# Patient Record
Sex: Male | Born: 1940 | Race: Black or African American | Hispanic: No | Marital: Married | Smoking: Never smoker
Health system: Southern US, Community
[De-identification: ages and names within clinical notes are randomized; demographics above are authoritative.]

## PROBLEM LIST (undated history)

## (undated) DIAGNOSIS — I10 Essential (primary) hypertension: Secondary | ICD-10-CM

## (undated) DIAGNOSIS — I499 Cardiac arrhythmia, unspecified: Secondary | ICD-10-CM

## (undated) HISTORY — PX: CHOLECYSTECTOMY: SHX55

---

## 2006-07-08 ENCOUNTER — Encounter: Admission: RE | Admit: 2006-07-08 | Discharge: 2006-07-08 | Payer: Self-pay | Admitting: Internal Medicine

## 2009-04-16 ENCOUNTER — Ambulatory Visit: Payer: Self-pay | Admitting: Interventional Radiology

## 2009-04-16 ENCOUNTER — Emergency Department (HOSPITAL_BASED_OUTPATIENT_CLINIC_OR_DEPARTMENT_OTHER): Admission: EM | Admit: 2009-04-16 | Discharge: 2009-04-17 | Payer: Self-pay | Admitting: Emergency Medicine

## 2009-04-17 ENCOUNTER — Inpatient Hospital Stay (HOSPITAL_COMMUNITY): Admission: EM | Admit: 2009-04-17 | Discharge: 2009-04-21 | Payer: Self-pay | Admitting: Emergency Medicine

## 2009-04-18 ENCOUNTER — Encounter (INDEPENDENT_AMBULATORY_CARE_PROVIDER_SITE_OTHER): Payer: Self-pay

## 2010-01-06 ENCOUNTER — Emergency Department (HOSPITAL_BASED_OUTPATIENT_CLINIC_OR_DEPARTMENT_OTHER)
Admission: EM | Admit: 2010-01-06 | Discharge: 2010-01-07 | Payer: Self-pay | Source: Home / Self Care | Admitting: Emergency Medicine

## 2010-04-17 LAB — COMPREHENSIVE METABOLIC PANEL
ALT: 92 U/L — ABNORMAL HIGH (ref 0–53)
AST: 34 U/L (ref 0–37)
AST: 82 U/L — ABNORMAL HIGH (ref 0–37)
AST: 91 U/L — ABNORMAL HIGH (ref 0–37)
AST: 58 U/L — ABNORMAL HIGH (ref 0–37)
Albumin: 3.5 g/dL (ref 3.5–5.2)
Alkaline Phosphatase: 86 U/L (ref 39–117)
BUN: 17 mg/dL (ref 6–23)
CO2: 28 mEq/L (ref 19–32)
CO2: 29 mEq/L (ref 19–32)
CO2: 29 mEq/L (ref 19–32)
Calcium: 7.8 mg/dL — ABNORMAL LOW (ref 8.4–10.5)
Chloride: 97 mEq/L (ref 96–112)
Creatinine, Ser: 1.66 mg/dL — ABNORMAL HIGH (ref 0.4–1.5)
Creatinine, Ser: 2.16 mg/dL — ABNORMAL HIGH (ref 0.4–1.5)
Creatinine, Ser: 1.3 mg/dL (ref 0.4–1.5)
GFR calc Af Amer: 37 mL/min — ABNORMAL LOW (ref >60)
GFR calc Af Amer: 50 mL/min — ABNORMAL LOW (ref >60)
GFR calc Af Amer: 55 mL/min — ABNORMAL LOW (ref >60)
GFR calc non Af Amer: 31 mL/min — ABNORMAL LOW (ref >60)
GFR calc non Af Amer: 55 mL/min — ABNORMAL LOW (ref >60)
Glucose, Bld: 188 mg/dL — ABNORMAL HIGH (ref 70–99)
Glucose, Bld: 217 mg/dL — ABNORMAL HIGH (ref 70–99)
Potassium: 3.6 mEq/L (ref 3.5–5.1)
Sodium: 135 mEq/L (ref 135–145)
Total Bilirubin: 0.9 mg/dL (ref 0.3–1.2)
Total Bilirubin: 0.7 mg/dL (ref 0.3–1.2)

## 2010-04-17 LAB — URINALYSIS, ROUTINE W REFLEX MICROSCOPIC
Bilirubin Urine: NEGATIVE
Bilirubin Urine: NEGATIVE
Glucose, UA: 100 mg/dL — AB
Ketones, ur: NEGATIVE mg/dL
Leukocytes, UA: NEGATIVE
Leukocytes, UA: NEGATIVE
Nitrite: NEGATIVE
Protein, ur: 30 mg/dL — AB
Urobilinogen, UA: 0.2 mg/dL (ref 0.0–1.0)
pH: 5.5 (ref 5.0–8.0)

## 2010-04-17 LAB — BASIC METABOLIC PANEL
CO2: 26 mEq/L (ref 19–32)
Chloride: 98 mEq/L (ref 96–112)
Creatinine, Ser: 1.59 mg/dL — ABNORMAL HIGH (ref 0.4–1.5)
GFR calc Af Amer: 53 mL/min — ABNORMAL LOW (ref >60)
Sodium: 133 mEq/L — ABNORMAL LOW (ref 135–145)

## 2010-04-17 LAB — DIFFERENTIAL
Basophils Absolute: 0 10*3/uL (ref 0.0–0.1)
Basophils Absolute: 0.3 10*3/uL — ABNORMAL HIGH (ref 0.0–0.1)
Basophils Relative: 0 % (ref 0–1)
Eosinophils Absolute: 0 10*3/uL (ref 0.0–0.7)
Lymphocytes Relative: 6 % — ABNORMAL LOW (ref 12–46)
Lymphocytes Relative: 11 % — ABNORMAL LOW (ref 12–46)
Lymphs Abs: 1.1 10*3/uL (ref 0.7–4.0)
Monocytes Absolute: 1 10*3/uL (ref 0.1–1.0)
Monocytes Relative: 7 % (ref 3–12)
Neutro Abs: 13.3 10*3/uL — ABNORMAL HIGH (ref 1.7–7.7)
Neutrophils Relative %: 81 % — ABNORMAL HIGH (ref 43–77)

## 2010-04-17 LAB — GLUCOSE, CAPILLARY
Glucose-Capillary: 164 mg/dL — ABNORMAL HIGH (ref 70–99)
Glucose-Capillary: 184 mg/dL — ABNORMAL HIGH (ref 70–99)
Glucose-Capillary: 196 mg/dL — ABNORMAL HIGH (ref 70–99)
Glucose-Capillary: 206 mg/dL — ABNORMAL HIGH (ref 70–99)
Glucose-Capillary: 211 mg/dL — ABNORMAL HIGH (ref 70–99)
Glucose-Capillary: 215 mg/dL — ABNORMAL HIGH (ref 70–99)
Glucose-Capillary: 222 mg/dL — ABNORMAL HIGH (ref 70–99)
Glucose-Capillary: 223 mg/dL — ABNORMAL HIGH (ref 70–99)
Glucose-Capillary: 248 mg/dL — ABNORMAL HIGH (ref 70–99)
Glucose-Capillary: 249 mg/dL — ABNORMAL HIGH (ref 70–99)
Glucose-Capillary: 249 mg/dL — ABNORMAL HIGH (ref 70–99)

## 2010-04-17 LAB — HEMOGLOBIN A1C
Hgb A1c MFr Bld: 7.4 % — ABNORMAL HIGH (ref 4.6–6.1)
Mean Plasma Glucose: 166 mg/dL

## 2010-04-17 LAB — CBC
HCT: 31 % — ABNORMAL LOW (ref 39.0–52.0)
HCT: 38.5 % — ABNORMAL LOW (ref 39.0–52.0)
Hemoglobin: 11.1 g/dL — ABNORMAL LOW (ref 13.0–17.0)
Hemoglobin: 9.9 g/dL — ABNORMAL LOW (ref 13.0–17.0)
MCHC: 31.8 g/dL (ref 30.0–36.0)
MCHC: 32.1 g/dL (ref 30.0–36.0)
MCHC: 32.4 g/dL (ref 30.0–36.0)
MCV: 73.4 fL — ABNORMAL LOW (ref 78.0–100.0)
MCV: 73.9 fL — ABNORMAL LOW (ref 78.0–100.0)
MCV: 74.2 fL — ABNORMAL LOW (ref 78.0–100.0)
MCV: 74.6 fL — ABNORMAL LOW (ref 78.0–100.0)
Platelets: 151 10*3/uL (ref 150–400)
Platelets: 168 10*3/uL (ref 150–400)
Platelets: 196 10*3/uL (ref 150–400)
RBC: 4.22 MIL/uL (ref 4.22–5.81)
RBC: 4.43 MIL/uL (ref 4.22–5.81)
RBC: 5.15 MIL/uL (ref 4.22–5.81)
RDW: 15.5 % (ref 11.5–15.5)
WBC: 16.4 10*3/uL — ABNORMAL HIGH (ref 4.0–10.5)

## 2010-04-17 LAB — URINE MICROSCOPIC-ADD ON

## 2010-04-17 LAB — HEMOCCULT GUIAC POC 1CARD (OFFICE)

## 2011-03-11 ENCOUNTER — Encounter (HOSPITAL_BASED_OUTPATIENT_CLINIC_OR_DEPARTMENT_OTHER): Payer: Self-pay | Admitting: *Deleted

## 2011-03-11 ENCOUNTER — Emergency Department (HOSPITAL_BASED_OUTPATIENT_CLINIC_OR_DEPARTMENT_OTHER)
Admission: EM | Admit: 2011-03-11 | Discharge: 2011-03-11 | Discharge status: Against Medical Advice | Payer: Commercial Managed Care - PPO | Attending: Emergency Medicine | Admitting: Emergency Medicine

## 2011-03-11 DIAGNOSIS — M25579 Pain in unspecified ankle and joints of unspecified foot: Secondary | ICD-10-CM | POA: Insufficient documentation

## 2011-03-11 DIAGNOSIS — M25529 Pain in unspecified elbow: Secondary | ICD-10-CM | POA: Insufficient documentation

## 2011-03-11 HISTORY — DX: Essential (primary) hypertension: I10

## 2011-03-11 NOTE — ED Notes (Signed)
Per registration pt decided to leave- pt did not speak to RN before leaving department

## 2011-03-11 NOTE — ED Notes (Signed)
Hx of gout- pain started yesterday in left ankle and right elbow- cough x 1 week- requests chest xray

## 2012-02-05 ENCOUNTER — Ambulatory Visit: Payer: Commercial Managed Care - PPO | Admitting: *Deleted

## 2012-02-28 ENCOUNTER — Ambulatory Visit: Payer: Commercial Managed Care - PPO | Admitting: *Deleted

## 2012-04-07 ENCOUNTER — Ambulatory Visit: Payer: Commercial Managed Care - PPO | Admitting: *Deleted

## 2013-09-26 ENCOUNTER — Encounter: Payer: Self-pay | Admitting: *Deleted

## 2013-09-26 DIAGNOSIS — I10 Essential (primary) hypertension: Secondary | ICD-10-CM

## 2018-02-27 ENCOUNTER — Emergency Department (HOSPITAL_BASED_OUTPATIENT_CLINIC_OR_DEPARTMENT_OTHER)
Admission: EM | Admit: 2018-02-27 | Discharge: 2018-02-27 | Disposition: A | Discharge status: Home / Self Care | Payer: Medicare HMO | Attending: Emergency Medicine | Admitting: Emergency Medicine

## 2018-02-27 ENCOUNTER — Emergency Department (HOSPITAL_BASED_OUTPATIENT_CLINIC_OR_DEPARTMENT_OTHER): Payer: Medicare HMO

## 2018-02-27 ENCOUNTER — Encounter (HOSPITAL_BASED_OUTPATIENT_CLINIC_OR_DEPARTMENT_OTHER): Payer: Self-pay | Admitting: *Deleted

## 2018-02-27 ENCOUNTER — Other Ambulatory Visit: Payer: Self-pay

## 2018-02-27 DIAGNOSIS — E119 Type 2 diabetes mellitus without complications: Secondary | ICD-10-CM | POA: Insufficient documentation

## 2018-02-27 DIAGNOSIS — Z9049 Acquired absence of other specified parts of digestive tract: Secondary | ICD-10-CM | POA: Diagnosis not present

## 2018-02-27 DIAGNOSIS — Z79899 Other long term (current) drug therapy: Secondary | ICD-10-CM | POA: Insufficient documentation

## 2018-02-27 DIAGNOSIS — R0981 Nasal congestion: Secondary | ICD-10-CM | POA: Insufficient documentation

## 2018-02-27 DIAGNOSIS — M7918 Myalgia, other site: Secondary | ICD-10-CM | POA: Insufficient documentation

## 2018-02-27 DIAGNOSIS — Z7984 Long term (current) use of oral hypoglycemic drugs: Secondary | ICD-10-CM | POA: Insufficient documentation

## 2018-02-27 DIAGNOSIS — I10 Essential (primary) hypertension: Secondary | ICD-10-CM | POA: Diagnosis not present

## 2018-02-27 DIAGNOSIS — R05 Cough: Secondary | ICD-10-CM | POA: Diagnosis not present

## 2018-02-27 DIAGNOSIS — R6889 Other general symptoms and signs: Secondary | ICD-10-CM

## 2018-02-27 MED ORDER — OSELTAMIVIR PHOSPHATE 75 MG PO CAPS
75.0000 mg | ORAL_CAPSULE | Freq: Once | ORAL | Status: AC
  Administered 2018-02-27: 75 mg via ORAL
  Filled 2018-02-27: qty 1

## 2018-02-27 MED ORDER — DM-GUAIFENESIN ER 30-600 MG PO TB12: 1.0000 | ORAL_TABLET | Freq: Two times a day (BID) | ORAL | 0 refills | Status: DC

## 2018-02-27 MED ORDER — OSELTAMIVIR PHOSPHATE 75 MG PO CAPS: 75.0000 mg | ORAL_CAPSULE | Freq: Two times a day (BID) | ORAL | 0 refills | Status: DC

## 2018-02-27 NOTE — ED Triage Notes (Signed)
Cough with white sputum. Body aches, weakness. Hiccups.

## 2018-02-27 NOTE — Discharge Instructions (Addendum)
Symptoms seem to be flulike in nature.  Take the Tamiflu as directed.  Take the Mucinex DM as needed for the cough and congestion.  Return for any new or worse symptoms.  Work note provided.

## 2018-02-27 NOTE — ED Notes (Signed)
Patient is A&Ox4.  No signs of distress noted.  Please see providers complete history and physical exam.  

## 2018-02-27 NOTE — ED Provider Notes (Signed)
[facility] EMERGENCY DEPARTMENT Provider Note   CSN: [id number] Arrival date & time: 02/27/18  1901     History   Chief Complaint No chief complaint on file.   HPI Martin Richards is a 78 y.o. male.   Patient had the flu shot this year year.  Presenting with cough productive cough some body aches weakness had hiccups that have now resolved.  Symptoms started on Tuesday evening.  No fever no nausea vomiting no diarrhea.  Had some flu exposure.     Past Medical History:  Diagnosis Date  . Diabetes mellitus   . Gout   . Hypertension     Patient Active Problem List   Diagnosis Date Noted  . Hypertension     Past Surgical History:  Procedure Laterality Date  . CHOLECYSTECTOMY          Home Medications    Prior to Admission medications   Medication Sig Start Date End Date Taking? Authorizing Provider  allopurinol (ZYLOPRIM) 100 MG tablet Take 100 mg by mouth daily.   Yes [provider]  Multiple Vitamin (MULITIVITAMIN WITH MINERALS) TABS Take 1 tablet by mouth daily.   Yes [provider]  SitaGLIPtin-MetFORMIN HCl (JANUMET PO) Take 1 tablet by mouth daily.   Yes [provider]  Valsartan-Hydrochlorothiazide (DIOVAN HCT PO) Take 1 tablet by mouth daily.   Yes [provider]  dextromethorphan-guaiFENesin (MUCINEX DM) 30-600 MG 12hr tablet Take 1 tablet by mouth 2 (two) times daily. 02/27/18   Fredia Sorrow, MD  oseltamivir (TAMIFLU) 75 MG capsule Take 1 capsule (75 mg total) by mouth every 12 (twelve) hours. 02/27/18   Fredia Sorrow, MD  Phenylephrine-DM-GG-APAP ([place] FAST-MAX COLD FLU) 5-10-200-325 MG/10ML LIQD Take 20 mLs by mouth every 4 (four) hours as needed. For congestion    [provider]    Family History Family History  Problem Relation Age of Onset  . Hypertension Mother   . Hypertension Father   . Cancer Sister   . Diabetes Brother   . Hypertension Brother     Social History Social  History   Tobacco Use  . Smoking status: Never Smoker  . Smokeless tobacco: Never Used  Substance Use Topics  . Alcohol use: Yes    Comment: rare  . Drug use: No     Allergies   Patient has no known allergies.   Review of Systems Review of Systems  Constitutional: Negative for chills and fever.  HENT: Positive for congestion. Negative for rhinorrhea and sore throat.   Eyes: Negative for visual disturbance.  Respiratory: Positive for cough. Negative for shortness of breath.   Cardiovascular: Negative for chest pain and leg swelling.  Gastrointestinal: Negative for abdominal pain, diarrhea, nausea and vomiting.  Genitourinary: Negative for dysuria.  Musculoskeletal: Positive for myalgias. Negative for back pain and neck pain.  Skin: Negative for rash.  Neurological: Negative for dizziness, light-headedness and headaches.  Hematological: Does not bruise/bleed easily.  Psychiatric/Behavioral: Negative for confusion.     Physical Exam Updated Vital Signs BP 139/79 (BP Location: Right Arm)   Pulse 87   Temp 98.6 F (37 C) (Oral)   Resp 18   Ht 1.892 m (6' 2.5")   Wt 99.8 kg   SpO2 98%   BMI 27.87 kg/m   Physical Exam Vitals signs and nursing note reviewed.  Constitutional:      Appearance: He is well-developed.  HENT:     Head: Normocephalic and atraumatic.     Mouth/Throat:  Mouth: Mucous membranes are moist.     Pharynx: Oropharynx is clear.  Eyes:     Extraocular Movements: Extraocular movements intact.     Conjunctiva/sclera: Conjunctivae normal.     Pupils: Pupils are equal, round, and reactive to light.  Neck:     Musculoskeletal: Neck supple.  Cardiovascular:     Rate and Rhythm: Normal rate and regular rhythm.     Heart sounds: No murmur.  Pulmonary:     Effort: Pulmonary effort is normal. No respiratory distress.     Breath sounds: Normal breath sounds. No wheezing or rales.  Chest:     Chest wall: No tenderness.  Abdominal:     Palpations:  Abdomen is soft.     Tenderness: There is no abdominal tenderness.  Musculoskeletal: Normal range of motion.  Skin:    General: Skin is warm and dry.     Capillary Refill: Capillary refill takes less than 2 seconds.  Neurological:     General: No focal deficit present.     Mental Status: He is alert and oriented to person, place, and time.      ED Treatments / Results  Labs (all labs ordered are listed, but only abnormal results are displayed) Labs Reviewed - No data to display  EKG None  Radiology Dg Chest 2 View  Result Date: 02/27/2018 CLINICAL DATA:  Cough productive of sputum. EXAM: CHEST - 2 VIEW COMPARISON:  04/17/2017 FINDINGS: The heart size and mediastinal contours are within normal limits. Both lungs are clear. The visualized skeletal structures are unremarkable. IMPRESSION: No active cardiopulmonary disease. Electronically Signed   By: Kerby Moors M.D.   On: 02/27/2018 20:16    Procedures Procedures (including critical care time)  Medications Ordered in ED Medications  oseltamivir (TAMIFLU) capsule 75 mg (75 mg Oral Given 02/27/18 2248)     Initial Impression / Assessment and Plan / ED Course  I have reviewed the triage vital signs and the nursing notes.  Pertinent labs & imaging results that were available during my care of the patient were reviewed by me and considered in my medical decision making (see chart for details).     Patient symptoms flulike in nature.  Onset was Tuesday evening so he still in the 48 window.  Will give first dose of Tamiflu here and continue Tamiflu at home and Mucinex DM for the cough and congestion.  Patient did have the flu shot which is probably helping to minimize the severity of the symptoms.  Also could be just upper respiratory infection.  Chest x-ray negative for pneumonia.  Final Clinical Impressions(s) / ED Diagnoses   Final diagnoses:  Flu-like symptoms    ED Discharge Orders         Ordered     dextromethorphan-guaiFENesin [facility] DM) 30-600 MG 12hr tablet  2 times daily     02/27/18 2256    oseltamivir (TAMIFLU) 75 MG capsule  Every 12 hours     02/27/18 2256           Fredia Sorrow, MD 02/28/18 [phone]

## 2019-03-03 ENCOUNTER — Other Ambulatory Visit: Payer: Self-pay

## 2019-03-03 ENCOUNTER — Emergency Department (HOSPITAL_COMMUNITY): Payer: Medicare HMO

## 2019-03-03 ENCOUNTER — Inpatient Hospital Stay (HOSPITAL_COMMUNITY)
Admission: EM | Admit: 2019-03-03 | Discharge: 2019-03-08 | DRG: 683 | Disposition: A | Discharge status: Home / Self Care | Payer: Medicare HMO | Source: Ambulatory Visit | Attending: Internal Medicine | Admitting: Internal Medicine

## 2019-03-03 ENCOUNTER — Other Ambulatory Visit: Payer: Self-pay | Admitting: Physician Assistant

## 2019-03-03 ENCOUNTER — Encounter (HOSPITAL_COMMUNITY): Payer: Self-pay | Admitting: Emergency Medicine

## 2019-03-03 DIAGNOSIS — E785 Hyperlipidemia, unspecified: Secondary | ICD-10-CM | POA: Diagnosis present

## 2019-03-03 DIAGNOSIS — D351 Benign neoplasm of parathyroid gland: Secondary | ICD-10-CM | POA: Diagnosis present

## 2019-03-03 DIAGNOSIS — N189 Chronic kidney disease, unspecified: Secondary | ICD-10-CM | POA: Diagnosis present

## 2019-03-03 DIAGNOSIS — D509 Iron deficiency anemia, unspecified: Secondary | ICD-10-CM | POA: Diagnosis present

## 2019-03-03 DIAGNOSIS — I10 Essential (primary) hypertension: Secondary | ICD-10-CM | POA: Diagnosis not present

## 2019-03-03 DIAGNOSIS — N183 Chronic kidney disease, stage 3 unspecified: Secondary | ICD-10-CM | POA: Diagnosis not present

## 2019-03-03 DIAGNOSIS — Z20822 Contact with and (suspected) exposure to covid-19: Secondary | ICD-10-CM | POA: Diagnosis present

## 2019-03-03 DIAGNOSIS — E1122 Type 2 diabetes mellitus with diabetic chronic kidney disease: Secondary | ICD-10-CM | POA: Diagnosis present

## 2019-03-03 DIAGNOSIS — N400 Enlarged prostate without lower urinary tract symptoms: Secondary | ICD-10-CM | POA: Diagnosis present

## 2019-03-03 DIAGNOSIS — E860 Dehydration: Secondary | ICD-10-CM | POA: Diagnosis present

## 2019-03-03 DIAGNOSIS — M109 Gout, unspecified: Secondary | ICD-10-CM | POA: Diagnosis present

## 2019-03-03 DIAGNOSIS — I4891 Unspecified atrial fibrillation: Secondary | ICD-10-CM | POA: Diagnosis not present

## 2019-03-03 DIAGNOSIS — R0600 Dyspnea, unspecified: Secondary | ICD-10-CM | POA: Diagnosis not present

## 2019-03-03 DIAGNOSIS — N1832 Chronic kidney disease, stage 3b: Secondary | ICD-10-CM | POA: Diagnosis present

## 2019-03-03 DIAGNOSIS — Z9119 Patient's noncompliance with other medical treatment and regimen: Secondary | ICD-10-CM

## 2019-03-03 DIAGNOSIS — I480 Paroxysmal atrial fibrillation: Secondary | ICD-10-CM | POA: Diagnosis present

## 2019-03-03 DIAGNOSIS — Z9181 History of falling: Secondary | ICD-10-CM

## 2019-03-03 DIAGNOSIS — E118 Type 2 diabetes mellitus with unspecified complications: Secondary | ICD-10-CM | POA: Diagnosis not present

## 2019-03-03 DIAGNOSIS — Z79899 Other long term (current) drug therapy: Secondary | ICD-10-CM

## 2019-03-03 DIAGNOSIS — M6282 Rhabdomyolysis: Secondary | ICD-10-CM | POA: Diagnosis present

## 2019-03-03 DIAGNOSIS — Z833 Family history of diabetes mellitus: Secondary | ICD-10-CM

## 2019-03-03 DIAGNOSIS — N179 Acute kidney failure, unspecified: Principal | ICD-10-CM | POA: Diagnosis present

## 2019-03-03 DIAGNOSIS — R7989 Other specified abnormal findings of blood chemistry: Secondary | ICD-10-CM

## 2019-03-03 DIAGNOSIS — I129 Hypertensive chronic kidney disease with stage 1 through stage 4 chronic kidney disease, or unspecified chronic kidney disease: Secondary | ICD-10-CM | POA: Diagnosis present

## 2019-03-03 DIAGNOSIS — K635 Polyp of colon: Secondary | ICD-10-CM | POA: Diagnosis present

## 2019-03-03 DIAGNOSIS — Z7984 Long term (current) use of oral hypoglycemic drugs: Secondary | ICD-10-CM | POA: Diagnosis not present

## 2019-03-03 DIAGNOSIS — Z8249 Family history of ischemic heart disease and other diseases of the circulatory system: Secondary | ICD-10-CM

## 2019-03-03 DIAGNOSIS — K5730 Diverticulosis of large intestine without perforation or abscess without bleeding: Secondary | ICD-10-CM | POA: Diagnosis present

## 2019-03-03 DIAGNOSIS — K5900 Constipation, unspecified: Secondary | ICD-10-CM | POA: Diagnosis present

## 2019-03-03 DIAGNOSIS — I471 Supraventricular tachycardia: Secondary | ICD-10-CM | POA: Diagnosis not present

## 2019-03-03 DIAGNOSIS — K2970 Gastritis, unspecified, without bleeding: Secondary | ICD-10-CM | POA: Diagnosis present

## 2019-03-03 LAB — PHOSPHORUS: Phosphorus: 2 mg/dL — ABNORMAL LOW (ref 2.5–4.6)

## 2019-03-03 LAB — URINALYSIS, ROUTINE W REFLEX MICROSCOPIC
Bilirubin Urine: NEGATIVE
Glucose, UA: NEGATIVE mg/dL
Hgb urine dipstick: NEGATIVE
Ketones, ur: NEGATIVE mg/dL
Leukocytes,Ua: NEGATIVE
Nitrite: NEGATIVE
Protein, ur: NEGATIVE mg/dL
Specific Gravity, Urine: 1.013 (ref 1.005–1.030)
pH: 7 (ref 5.0–8.0)

## 2019-03-03 LAB — SARS CORONAVIRUS 2 (TAT 6-24 HRS): SARS Coronavirus 2: NEGATIVE

## 2019-03-03 LAB — COMPREHENSIVE METABOLIC PANEL
ALT: 55 U/L — ABNORMAL HIGH (ref 0–44)
AST: 58 U/L — ABNORMAL HIGH (ref 15–41)
Albumin: 4.1 g/dL (ref 3.5–5.0)
Alkaline Phosphatase: 77 U/L (ref 38–126)
Anion gap: 8 (ref 5–15)
BUN: 22 mg/dL (ref 8–23)
CO2: 26 mmol/L (ref 22–32)
Calcium: 13.6 mg/dL (ref 8.9–10.3)
Chloride: 101 mmol/L (ref 98–111)
Creatinine, Ser: 2.23 mg/dL — ABNORMAL HIGH (ref 0.61–1.24)
GFR calc Af Amer: 32 mL/min — ABNORMAL LOW (ref >60)
GFR calc non Af Amer: 28 mL/min — ABNORMAL LOW (ref >60)
Glucose, Bld: 127 mg/dL — ABNORMAL HIGH (ref 70–99)
Potassium: 4.1 mmol/L (ref 3.5–5.1)
Sodium: 135 mmol/L (ref 135–145)
Total Bilirubin: 0.4 mg/dL (ref 0.3–1.2)
Total Protein: 7.2 g/dL (ref 6.5–8.1)

## 2019-03-03 LAB — CK: Total CK: 719 U/L — ABNORMAL HIGH (ref 49–397)

## 2019-03-03 LAB — RETICULOCYTES
Immature Retic Fract: 15 % (ref 2.3–15.9)
RBC.: 4.51 MIL/uL (ref 4.22–5.81)
Retic Count, Absolute: 62.7 10*3/uL (ref 19.0–186.0)
Retic Ct Pct: 1.4 % (ref 0.4–3.1)

## 2019-03-03 LAB — CBC
HCT: 34.4 % — ABNORMAL LOW (ref 39.0–52.0)
Hemoglobin: 10.4 g/dL — ABNORMAL LOW (ref 13.0–17.0)
MCH: 22.8 pg — ABNORMAL LOW (ref 26.0–34.0)
MCHC: 30.2 g/dL (ref 30.0–36.0)
MCV: 75.4 fL — ABNORMAL LOW (ref 80.0–100.0)
Platelets: 225 10*3/uL (ref 150–400)
RBC: 4.56 MIL/uL (ref 4.22–5.81)
RDW: 16.9 % — ABNORMAL HIGH (ref 11.5–15.5)
WBC: 8.1 10*3/uL (ref 4.0–10.5)
nRBC: 0 % (ref 0.0–0.2)

## 2019-03-03 LAB — LIPASE, BLOOD: Lipase: 24 U/L (ref 11–51)

## 2019-03-03 LAB — MAGNESIUM: Magnesium: 1.2 mg/dL — ABNORMAL LOW (ref 1.7–2.4)

## 2019-03-03 LAB — GLUCOSE, CAPILLARY: Glucose-Capillary: 96 mg/dL (ref 70–99)

## 2019-03-03 MED ORDER — ENOXAPARIN SODIUM 30 MG/0.3ML ~~LOC~~ SOLN
30.0000 mg | SUBCUTANEOUS | Status: DC
  Administered 2019-03-04: 30 mg via SUBCUTANEOUS
  Filled 2019-03-03: qty 0.3

## 2019-03-03 MED ORDER — ONDANSETRON HCL 4 MG PO TABS: 4.0000 mg | ORAL_TABLET | Freq: Four times a day (QID) | ORAL | Status: DC | PRN

## 2019-03-03 MED ORDER — ONDANSETRON HCL 4 MG/2ML IJ SOLN
4.0000 mg | Freq: Four times a day (QID) | INTRAMUSCULAR | Status: DC | PRN
  Administered 2019-03-05: 4 mg via INTRAVENOUS
  Filled 2019-03-03: qty 2

## 2019-03-03 MED ORDER — SODIUM CHLORIDE 0.9 % IV BOLUS (SEPSIS)
1000.0000 mL | Freq: Once | INTRAVENOUS | Status: AC
  Administered 2019-03-03: 1000 mL via INTRAVENOUS

## 2019-03-03 MED ORDER — MAGNESIUM SULFATE 2 GM/50ML IV SOLN
2.0000 g | Freq: Once | INTRAVENOUS | Status: AC
  Administered 2019-03-03: 2 g via INTRAVENOUS
  Filled 2019-03-03: qty 50

## 2019-03-03 MED ORDER — SODIUM CHLORIDE 0.9 % IV SOLN: INTRAVENOUS | Status: AC

## 2019-03-03 MED ORDER — SODIUM PHOSPHATES 45 MMOLE/15ML IV SOLN
10.0000 mmol | Freq: Once | INTRAVENOUS | Status: AC
  Administered 2019-03-03: 10 mmol via INTRAVENOUS
  Filled 2019-03-03 (×2): qty 3.33

## 2019-03-03 MED ORDER — HYDROCODONE-ACETAMINOPHEN 5-325 MG PO TABS: 1.0000 | ORAL_TABLET | ORAL | Status: DC | PRN

## 2019-03-03 MED ORDER — INSULIN ASPART 100 UNIT/ML ~~LOC~~ SOLN
0.0000 [IU] | SUBCUTANEOUS | Status: DC
  Administered 2019-03-04: 2 [IU] via SUBCUTANEOUS
  Administered 2019-03-04: 1 [IU] via SUBCUTANEOUS
  Administered 2019-03-04: 2 [IU] via SUBCUTANEOUS
  Administered 2019-03-04: 3 [IU] via SUBCUTANEOUS
  Administered 2019-03-05: 1 [IU] via SUBCUTANEOUS
  Administered 2019-03-05 (×3): 2 [IU] via SUBCUTANEOUS
  Administered 2019-03-05: 3 [IU] via SUBCUTANEOUS
  Administered 2019-03-05: 2 [IU] via SUBCUTANEOUS
  Administered 2019-03-06 (×3): 1 [IU] via SUBCUTANEOUS
  Administered 2019-03-07 (×2): 2 [IU] via SUBCUTANEOUS
  Administered 2019-03-07: 1 [IU] via SUBCUTANEOUS
  Administered 2019-03-08 (×2): 2 [IU] via SUBCUTANEOUS

## 2019-03-03 MED ORDER — ACETAMINOPHEN 650 MG RE SUPP: 650.0000 mg | Freq: Four times a day (QID) | RECTAL | Status: DC | PRN

## 2019-03-03 MED ORDER — SODIUM CHLORIDE 0.9% FLUSH: 3.0000 mL | Freq: Once | INTRAVENOUS | Status: DC

## 2019-03-03 MED ORDER — ACETAMINOPHEN 325 MG PO TABS: 650.0000 mg | ORAL_TABLET | Freq: Four times a day (QID) | ORAL | Status: DC | PRN

## 2019-03-03 NOTE — H&P (Signed)
Martin Richards [id number] DOB: 1940/03/19 DOA: 03/03/2019     PCP: Patient, No Pcp Per   Outpatient Specialists:  NONE    Patient arrived to ER on 03/03/19 at 1614  Patient coming from: home Lives With family    Chief Complaint:   Chief Complaint  Patient presents with  . Dehydration  . Abdominal Pain    HPI: Martin Richards is a 79 y.o. male with medical history significant of HLD, CKD, HTN, anemia    Presented with PCP office she was told that he is dehydrated and orthostatic and was instructed to come being evaluated emergency department. Patient states that he has not been eating and drinking very well lately has been drinking a lot of sodas and very little water he reports he lost 8 pounds over the past week thinking that is because he has not been drinking water. He had a brief episode of abdominal pain today but otherwise no other complaints At he was seen by his primary care provider and his diabetes medications was changed because he was told that he has too much calcium in it. He denies taking Tums or any other supplements over-the-counter He does occasionally take Advil PM and Alka-Seltzer. Patient states he usually does not drink but have had some alcohol lately to control his pain in the abdomen although was very mild reports he did not change his pain Infectious risk factors:  Reports fatigue    In  ER  cOVID TEST   PCR testing  Pending  No results found for: SARSCOV2NAA   Regarding pertinent Chronic problems:      HTN on Diovan  Gout on allopurinol       DM 2 -  Lab Results  Component Value Date   HGBA1C (H) 04/17/2009    7.4 (NOTE) The ADA recommends the following therapeutic goal for glycemic control related to Hgb A1c measurement: Goal of therapy: <6.5 Hgb A1c  Reference: American Diabetes Association: Clinical Practice Recommendations 2010, Diabetes Care, 2010, 33: (Suppl  1).    on PO meds only,          CKD stage III - baseline  Cr 1.6 Lab Results  Component Value Date   CREATININE 2.23 (H) 03/03/2019   CREATININE 1.59 (H) 04/21/2009   CREATININE 2.16 (H) 04/20/2009      While in ER: Found to have calcium of 13.6 and creatinine elevated to 2.5 from baseline 1.6  mg at 1.2 Phosphorus 2.0  The following Work up has been ordered so far:  Orders Placed This Encounter  Procedures  . SARS CORONAVIRUS 2 (TAT 6-24 HRS) Nasopharyngeal Nasopharyngeal Swab  . Lipase, blood  . Comprehensive metabolic panel  . CBC  . Urinalysis, Routine w reflex microscopic  . Diet NPO time specified  . Saline Lock IV, Maintain IV access  . Consult to hospitalist  ALL PATIENTS BEING ADMITTED/HAVING PROCEDURES NEED COVID-19 SCREENING     Following Medications were ordered in ER: Medications  sodium chloride flush (NS) 0.9 % injection 3 mL (has no administration in time range)  sodium chloride 0.9 % bolus 1,000 mL (1,000 mLs Intravenous New Bag/Given 03/03/19 1938)        Consult Orders  (From admission, onward)         Start     Ordered   03/03/19 1929  Consult to hospitalist  ALL PATIENTS BEING ADMITTED/HAVING PROCEDURES NEED COVID-19 SCREENING  Once    Comments: ALL PATIENTS BEING ADMITTED/HAVING PROCEDURES NEED  COVID-19 SCREENING  Provider:  (Not yet assigned)  Question Answer Comment  Place call to: [facility],  call [phone]   Reason for Consult Admit      03/03/19 1929           Significant initial  Findings: Abnormal Labs Reviewed  COMPREHENSIVE METABOLIC PANEL - Abnormal; Notable for the following components:      Result Value   Glucose, Bld 127 (*)    Creatinine, Ser 2.23 (*)    Calcium 13.6 (*)    AST 58 (*)    ALT 55 (*)    GFR calc non Af Amer 28 (*)    GFR calc Af Amer 32 (*)    All other components within normal limits  CBC - Abnormal; Notable for the following components:   Hemoglobin 10.4 (*)    HCT 34.4 (*)    MCV 75.4 (*)    MCH 22.8 (*)    RDW 16.9 (*)    All other components  within normal limits     Otherwise labs showing:    Recent Labs  Lab 03/03/19 1644  NA 135  K 4.1  CO2 26  GLUCOSE 127*  BUN 22  CREATININE 2.23*  CALCIUM 13.6*    Cr    Up from baseline see below Lab Results  Component Value Date   CREATININE 2.23 (H) 03/03/2019   CREATININE 1.59 (H) 04/21/2009   CREATININE 2.16 (H) 04/20/2009    Recent Labs  Lab 03/03/19 1644  AST 58*  ALT 55*  ALKPHOS 77  BILITOT 0.4  PROT 7.2  ALBUMIN 4.1   Lab Results  Component Value Date   CALCIUM 13.6 (HH) 03/03/2019     WBC      Component Value Date/Time   WBC 8.1 03/03/2019 1644   ANC    Component Value Date/Time   NEUTROABS 16.5 (H) 04/17/2009 1806   ALC No components found for: LYMPHAB    Plt: Lab Results  Component Value Date   PLT 225 03/03/2019       COVID-19 Labs  No results for input(s): DDIMER, FERRITIN, LDH, CRP in the last 72 hours.  No results found for: SARSCOV2NAA    HG/HCT   stable,      Component Value Date/Time   HGB 10.4 (L) 03/03/2019 1644   HCT 34.4 (L) 03/03/2019 1644    Recent Labs  Lab 03/03/19 1644  LIPASE 24   No results for input(s): AMMONIA in the last 168 hours.  No components found for: LABALBU    Cardiac Panel (last 3 results) Recent Labs    03/03/19 2025  CKTOTAL 719*       ECG: Ordered     UA  no evidence of UTI    Urine analysis:    Component Value Date/Time   COLORURINE YELLOW 03/03/2019 2152   APPEARANCEUR CLEAR 03/03/2019 2152   LABSPEC 1.013 03/03/2019 2152   PHURINE 7.0 03/03/2019 2152   GLUCOSEU NEGATIVE 03/03/2019 2152   HGBUR NEGATIVE 03/03/2019 2152   BILIRUBINUR NEGATIVE 03/03/2019 2152   KETONESUR NEGATIVE 03/03/2019 2152   PROTEINUR NEGATIVE 03/03/2019 2152   UROBILINOGEN 0.2 04/17/2009 1913   NITRITE NEGATIVE 03/03/2019 2152   LEUKOCYTESUR NEGATIVE 03/03/2019 2152       Ordered    CXR -  NON acute      ED Triage Vitals  Enc Vitals Group     BP 03/03/19 1619 (!) 148/78     Pulse  Rate 03/03/19 1619 72  Resp 03/03/19 1619 16     Temp 03/03/19 1619 98.4 F (36.9 C)     Temp Source 03/03/19 1619 Oral     SpO2 03/03/19 1619 100 %     Weight --      Height --      Head Circumference --      Peak Flow --      Pain Score 03/03/19 1637 1     Pain Loc --      Pain Edu? --      Excl. in [place]? --   TMAX(24)@       Latest  Blood pressure (!) 148/78, pulse 72, temperature 98.4 F (36.9 C), temperature source Oral, resp. rate 16, SpO2 100 %.     Hospitalist was called for admission for hypercalcemia, dehydration and rhabdo   Review of Systems:    Pertinent positives include: fatigue, weight loss  abdominal pain,  Constitutional:  No weight loss, night sweats, Fevers, chills,  HEENT:  No headaches, Difficulty swallowing,Tooth/dental problems,Sore throat,  No sneezing, itching, ear ache, nasal congestion, post nasal drip,  Cardio-vascular:  No chest pain, Orthopnea, PND, anasarca, dizziness, palpitations.no Bilateral lower extremity swelling  GI:  No heartburn, indigestion, nausea, vomiting, diarrhea, change in bowel habits, loss of appetite, melena, blood in stool, hematemesis Resp:  no shortness of breath at rest. No dyspnea on exertion, No excess mucus, no productive cough, No non-productive cough, No coughing up of blood.No change in color of mucus.No wheezing. Skin:  no rash or lesions. No jaundice GU:  no dysuria, change in color of urine, no urgency or frequency. No straining to urinate.  No flank pain.  Musculoskeletal:  No joint pain or no joint swelling. No decreased range of motion. No back pain.  Psych:  No change in mood or affect. No depression or anxiety. No memory loss.  Neuro: no localizing neurological complaints, no tingling, no weakness, no double vision, no gait abnormality, no slurred speech, no confusion  All systems reviewed and apart from [place] all are negative  Past Medical History:   Past Medical History:  Diagnosis Date  .  Diabetes mellitus   . Gout   . Hypertension       Past Surgical History:  Procedure Laterality Date  . CHOLECYSTECTOMY      Social History:  Ambulatory  independently      reports that he has never smoked. He has never used smokeless tobacco. He reports current alcohol use. He reports that he does not use drugs.     Family History:   Family History  Problem Relation Age of Onset  . Hypertension Mother   . Hypertension Father   . Cancer Sister   . Diabetes Brother   . Hypertension Brother     Allergies: No Known Allergies   Prior to Admission medications   Medication Sig Start Date End Date Taking? Authorizing Provider  allopurinol (ZYLOPRIM) 100 MG tablet Take 100 mg by mouth daily.    [provider]  dextromethorphan-guaiFENesin (MUCINEX DM) 30-600 MG 12hr tablet Take 1 tablet by mouth 2 (two) times daily. 02/27/18   Fredia Sorrow, MD  Multiple Vitamin (MULITIVITAMIN WITH MINERALS) TABS Take 1 tablet by mouth daily.    [provider]  oseltamivir (TAMIFLU) 75 MG capsule Take 1 capsule (75 mg total) by mouth every 12 (twelve) hours. 02/27/18   Fredia Sorrow, MD  Phenylephrine-DM-GG-APAP ([place] FAST-MAX COLD FLU) 5-10-200-325 MG/10ML LIQD Take 20 mLs by mouth every 4 (four) hours  as needed. For congestion    [provider]  SitaGLIPtin-MetFORMIN HCl (JANUMET PO) Take 1 tablet by mouth daily.    [provider]  Valsartan-Hydrochlorothiazide (DIOVAN HCT PO) Take 1 tablet by mouth daily.    [provider]   Physical Exam: Blood pressure (!) 148/78, pulse 72, temperature 98.4 F (36.9 C), temperature source Oral, resp. rate 16, SpO2 100 %. 1. General:  in No  Acute distress    Chronically ill  -appearing 2. Psychological: Alert and Oriented 3. Head/ENT:    Dry Mucous Membranes                          Head Non traumatic, neck supple                          Poor Dentition 4. SKIN:   decreased Skin turgor,  Skin clean  Dry and intact no rash 5. Heart: Regular rate and rhythm no  Murmur, no Rub or gallop 6. Lungs: no wheezes or crackles   7. Abdomen: Soft,  non-tender, Non distended   obese  bowel sounds present 8. Lower extremities: no clubbing, cyanosis, no edema 9. Neurologically Grossly intact, moving all 4 extremities equally  10. MSK: Normal range of motion   All other LABS:     Recent Labs  Lab 03/03/19 1644  WBC 8.1  HGB 10.4*  HCT 34.4*  MCV 75.4*  PLT 225     Recent Labs  Lab 03/03/19 1644  NA 135  K 4.1  CL 101  CO2 26  GLUCOSE 127*  BUN 22  CREATININE 2.23*  CALCIUM 13.6*     Recent Labs  Lab 03/03/19 1644  AST 58*  ALT 55*  ALKPHOS 77  BILITOT 0.4  PROT 7.2  ALBUMIN 4.1       Cultures: No results found for: SDES, SPECREQUEST, CULT, REPTSTATUS   Radiological Exams on Admission: DG Chest 2 View  Result Date: 03/03/2019 CLINICAL DATA:  Weakness. Dehydration. EXAM: CHEST - 2 VIEW COMPARISON:  Chest radiograph 02/27/2018 FINDINGS: The cardiomediastinal contours are normal. The lungs are clear. Pulmonary vasculature is normal. No consolidation, pleural effusion, or pneumothorax. No acute osseous abnormalities are seen. Degenerative change in the spine. IMPRESSION: No acute chest findings. Electronically Signed   By: Keith Rake M.D.   On: 03/03/2019 20:36    Chart has been reviewed   Assessment/Plan  79 y.o. male with medical history significant of HLD, CKD, HTN, anemia  Admitted for patient hypercalcemia rhabdomyolysis elevated LFT's, hypomagnesemia, hypophosphatemia  Present on Admission:  . Hypercalcemia -will rehydrate and repeat still elevated at 13 will  treat with calcitonin  No known underlying malignancy in no changes in mental status.  Obtain EKG. Check TSH, PTH, PTH related peptide Would need work up for malignancy  Hypomagnesemia - will replace  Hypophosphatemia - will replace    History of essential hypertension.  Hold home medications  restart when able may need different medications at the time of discharge such as beta-blockers given electrolytes abnormalities  . Acute on chronic renal failure (HCC) - hold Diovan for tonight avoid nephrotoxic medications obtain urine electrolytes    Dm2 -  Order Sensitive SSI   -  check TSH and HgA1C  - Hold by mouth medications    . Dehydration rehydrate and follow Recheck orthostatics   . Elevated LFTs most likely secondary to dehydration we will continue to follow  .  Rhabdomyolysis in the setting of decreased p.o. intake will rehydrate and follow   Other plan as per orders.  DVT prophylaxis: Lovenox     Code Status:  FULL CODE as per patient   I had personally discussed CODE STATUS with patient    Family Communication:   Family not at  Bedside    Disposition Plan:      To home once workup is complete and patient is stable                     Consults called: none  Admission status:  ED Disposition    ED Disposition Condition [place]: [place] [[id number]]  Level of Care: Telemetry Medical [[id number]]  May admit patient to [facility] or [facility] if equivalent level of care is available:: Yes  Covid Evaluation: Asymptomatic Screening Protocol (No Symptoms)  Diagnosis: Hypercalcemia [275.42.ICD-9-CM]  Admitting Physician: Toy Baker [[id number]]  Attending Physician: Toy Baker [[id number]]  Estimated length of stay: past midnight tomorrow  Certification:: I certify this patient will need inpatient services for at least 2 midnights         inpatient     Expect 2 midnight stay secondary to severity of patient's current illness including     Severe lab/radiological/exam abnormalities including:    hypercalcemia. rhabdo and extensive comorbidities including:    DM2  . CKD   That are currently affecting medical management.   I expect  patient to be hospitalized for 2 midnights requiring inpatient medical  care.  Patient is at high risk for adverse outcome (such as loss of life or disability) if not treated.  Indication for inpatient stay as follows:  Severe changes in electrolytes needing aggressive VI fluid ressusitation  Need for IV fluids,     Level of care    tele  For   24H    Precautions: admitted as   asymptomatic screening protocol  No active isolations   PPE: Used by the provider:   P100  eye Goggles,  Gloves    Vidhi Delellis 03/04/2019, 12:58 AM    [facility]     after 2 AM please page floor coverage PA If 7AM-7PM, please contact the day team taking care of the patient using [email or web address]   Patient was evaluated in the context of the global COVID-19 pandemic, which necessitated consideration that the patient might be at risk for infection with the SARS-CoV-2 virus that causes COVID-19. Institutional protocols and algorithms that pertain to the evaluation of patients at risk for COVID-19 are in a state of rapid change based on information released by regulatory bodies including the CDC and federal and state organizations. These policies and algorithms were followed during the patient's care.

## 2019-03-03 NOTE — ED Notes (Signed)
Pt made aware of need for urine sample.  

## 2019-03-03 NOTE — ED Triage Notes (Signed)
Patient comes from his MD's office with complaints of being dehydrated. Patient states that is lab work drawn today "showed I was dehydrated." Patient states that his MD told him that when he stands up his BP drops. Also, patient states he's having LLQ and RLQ abdominal pain for two weeks and hes got a colonoscopy scheduled for tomorrow.

## 2019-03-03 NOTE — Progress Notes (Signed)
Report received. Room ready.  

## 2019-03-03 NOTE — ED Provider Notes (Signed)
[facility] EMERGENCY DEPARTMENT Provider Note   CSN: [id number] Arrival date & time: 03/03/19  1614     History Chief Complaint  Patient presents with  . Dehydration  . Abdominal Pain    Martin Richards is a 79 y.o. male.  Patient complains of weakness.  He went to see his doctor today and he was orthostatic.  The history is provided by the patient. No language interpreter was used.  Weakness Severity:  Moderate Onset quality:  Sudden Timing:  Constant Progression:  Worsening Chronicity:  New Context: not alcohol use   Relieved by:  Nothing Associated symptoms: no abdominal pain, no chest pain, no cough, no diarrhea, no frequency, no headaches and no seizures        Past Medical History:  Diagnosis Date  . Diabetes mellitus   . Gout   . Hypertension     Patient Active Problem List   Diagnosis Date Noted  . Hypertension     Past Surgical History:  Procedure Laterality Date  . CHOLECYSTECTOMY         Family History  Problem Relation Age of Onset  . Hypertension Mother   . Hypertension Father   . Cancer Sister   . Diabetes Brother   . Hypertension Brother     Social History   Tobacco Use  . Smoking status: Never Smoker  . Smokeless tobacco: Never Used  Substance Use Topics  . Alcohol use: Yes    Comment: rare  . Drug use: No    Home Medications Prior to Admission medications   Medication Sig Start Date End Date Taking? Authorizing Provider  allopurinol (ZYLOPRIM) 100 MG tablet Take 100 mg by mouth daily.    [provider]  dextromethorphan-guaiFENesin (MUCINEX DM) 30-600 MG 12hr tablet Take 1 tablet by mouth 2 (two) times daily. 02/27/18   Fredia Sorrow, MD  Multiple Vitamin (MULITIVITAMIN WITH MINERALS) TABS Take 1 tablet by mouth daily.    [provider]  oseltamivir (TAMIFLU) 75 MG capsule Take 1 capsule (75 mg total) by mouth every 12 (twelve) hours. 02/27/18   Fredia Sorrow, MD    Phenylephrine-DM-GG-APAP ([place] FAST-MAX COLD FLU) 5-10-200-325 MG/10ML LIQD Take 20 mLs by mouth every 4 (four) hours as needed. For congestion    [provider]  SitaGLIPtin-MetFORMIN HCl (JANUMET PO) Take 1 tablet by mouth daily.    [provider]  Valsartan-Hydrochlorothiazide (DIOVAN HCT PO) Take 1 tablet by mouth daily.    [provider]    Allergies    Patient has no known allergies.  Review of Systems   Review of Systems  Constitutional: Negative for appetite change and fatigue.  HENT: Negative for congestion, ear discharge and sinus pressure.   Eyes: Negative for discharge.  Respiratory: Negative for cough.   Cardiovascular: Negative for chest pain.  Gastrointestinal: Negative for abdominal pain and diarrhea.  Genitourinary: Negative for frequency and hematuria.  Musculoskeletal: Negative for back pain.  Skin: Negative for rash.  Neurological: Positive for weakness. Negative for seizures and headaches.  Psychiatric/Behavioral: Negative for hallucinations.    Physical Exam Updated Vital Signs BP (!) 148/78   Pulse 72   Temp 98.4 F (36.9 C) (Oral)   Resp 16   SpO2 100%   Physical Exam Vitals and nursing note reviewed.  Constitutional:      Appearance: He is well-developed.  HENT:     Head: Normocephalic.     Nose: Nose normal.  Eyes:     General:  No scleral icterus.    Conjunctiva/sclera: Conjunctivae normal.  Neck:     Thyroid: No thyromegaly.  Cardiovascular:     Rate and Rhythm: Normal rate and regular rhythm.     Heart sounds: No murmur. No friction rub. No gallop.   Pulmonary:     Breath sounds: No stridor. No wheezing or rales.  Chest:     Chest wall: No tenderness.  Abdominal:     General: There is no distension.     Tenderness: There is no abdominal tenderness. There is no rebound.  Musculoskeletal:        General: Normal range of motion.     Cervical back: Neck supple.  Lymphadenopathy:     Cervical: No  cervical adenopathy.  Skin:    Findings: No erythema or rash.  Neurological:     Mental Status: He is alert and oriented to person, place, and time.     Motor: No abnormal muscle tone.     Coordination: Coordination normal.  Psychiatric:        Behavior: Behavior normal.     ED Results / Procedures / Treatments   Labs (all labs ordered are listed, but only abnormal results are displayed) Labs Reviewed  COMPREHENSIVE METABOLIC PANEL - Abnormal; Notable for the following components:      Result Value   Glucose, Bld 127 (*)    Creatinine, Ser 2.23 (*)    Calcium 13.6 (*)    AST 58 (*)    ALT 55 (*)    GFR calc non Af Amer 28 (*)    GFR calc Af Amer 32 (*)    All other components within normal limits  CBC - Abnormal; Notable for the following components:   Hemoglobin 10.4 (*)    HCT 34.4 (*)    MCV 75.4 (*)    MCH 22.8 (*)    RDW 16.9 (*)    All other components within normal limits  SARS CORONAVIRUS 2 (TAT 6-24 HRS)  LIPASE, BLOOD  URINALYSIS, ROUTINE W REFLEX MICROSCOPIC    EKG None  Radiology No results found.  Procedures Procedures (including critical care time)  Medications Ordered in ED Medications  sodium chloride flush (NS) 0.9 % injection 3 mL (has no administration in time range)  sodium chloride 0.9 % bolus 1,000 mL (1,000 mLs Intravenous New Bag/Given 03/03/19 1938)    ED Course  I have reviewed the triage vital signs and the nursing notes.  Pertinent labs & imaging results that were available during my care of the patient were reviewed by me and considered in my medical decision making (see chart for details).    MDM Rules/Calculators/A&P                      Patient with hypercalcemia.  He will be admitted for hydration and further work-up Final Clinical Impression(s) / ED Diagnoses Final diagnoses:  None    Rx / DC Orders ED Discharge Orders    None       Milton Ferguson, MD 03/03/19 2002

## 2019-03-04 ENCOUNTER — Inpatient Hospital Stay (HOSPITAL_COMMUNITY): Payer: Medicare HMO

## 2019-03-04 ENCOUNTER — Encounter (HOSPITAL_COMMUNITY): Payer: Self-pay | Admitting: Internal Medicine

## 2019-03-04 ENCOUNTER — Other Ambulatory Visit: Payer: Self-pay

## 2019-03-04 DIAGNOSIS — R0600 Dyspnea, unspecified: Secondary | ICD-10-CM

## 2019-03-04 LAB — CBC WITH DIFFERENTIAL/PLATELET
Abs Immature Granulocytes: 0.02 10*3/uL (ref 0.00–0.07)
Basophils Absolute: 0 10*3/uL (ref 0.0–0.1)
Basophils Relative: 0 %
Eosinophils Absolute: 0.1 10*3/uL (ref 0.0–0.5)
Eosinophils Relative: 1 %
HCT: 34.3 % — ABNORMAL LOW (ref 39.0–52.0)
Hemoglobin: 10.4 g/dL — ABNORMAL LOW (ref 13.0–17.0)
Immature Granulocytes: 0 %
Lymphocytes Relative: 32 %
Lymphs Abs: 3.3 10*3/uL (ref 0.7–4.0)
MCH: 22.9 pg — ABNORMAL LOW (ref 26.0–34.0)
MCHC: 30.3 g/dL (ref 30.0–36.0)
MCV: 75.6 fL — ABNORMAL LOW (ref 80.0–100.0)
Monocytes Absolute: 0.6 10*3/uL (ref 0.1–1.0)
Monocytes Relative: 6 %
Neutro Abs: 6.2 10*3/uL (ref 1.7–7.7)
Neutrophils Relative %: 61 %
Platelets: 219 10*3/uL (ref 150–400)
RBC: 4.54 MIL/uL (ref 4.22–5.81)
RDW: 16.9 % — ABNORMAL HIGH (ref 11.5–15.5)
WBC: 10.2 10*3/uL (ref 4.0–10.5)
nRBC: 0 % (ref 0.0–0.2)

## 2019-03-04 LAB — COMPREHENSIVE METABOLIC PANEL
ALT: 55 U/L — ABNORMAL HIGH (ref 0–44)
AST: 59 U/L — ABNORMAL HIGH (ref 15–41)
Albumin: 3.9 g/dL (ref 3.5–5.0)
Alkaline Phosphatase: 78 U/L (ref 38–126)
Anion gap: 12 (ref 5–15)
BUN: 18 mg/dL (ref 8–23)
CO2: 20 mmol/L — ABNORMAL LOW (ref 22–32)
Calcium: 12.8 mg/dL — ABNORMAL HIGH (ref 8.9–10.3)
Chloride: 104 mmol/L (ref 98–111)
Creatinine, Ser: 1.9 mg/dL — ABNORMAL HIGH (ref 0.61–1.24)
GFR calc Af Amer: 39 mL/min — ABNORMAL LOW (ref >60)
GFR calc non Af Amer: 33 mL/min — ABNORMAL LOW (ref >60)
Glucose, Bld: 215 mg/dL — ABNORMAL HIGH (ref 70–99)
Potassium: 3.6 mmol/L (ref 3.5–5.1)
Sodium: 136 mmol/L (ref 135–145)
Total Bilirubin: 0.7 mg/dL (ref 0.3–1.2)
Total Protein: 7.3 g/dL (ref 6.5–8.1)

## 2019-03-04 LAB — IRON AND TIBC
Iron: 76 ug/dL (ref 45–182)
Saturation Ratios: 17 % — ABNORMAL LOW (ref 17.9–39.5)
TIBC: 444 ug/dL (ref 250–450)
UIBC: 368 ug/dL

## 2019-03-04 LAB — ECHOCARDIOGRAM COMPLETE
Height: 74.5 in
Weight: 3441.6 oz

## 2019-03-04 LAB — ETHANOL: Alcohol, Ethyl (B): <10 mg/dL (ref <10)

## 2019-03-04 LAB — CBC
HCT: 34.9 % — ABNORMAL LOW (ref 39.0–52.0)
Hemoglobin: 10.8 g/dL — ABNORMAL LOW (ref 13.0–17.0)
MCH: 22.9 pg — ABNORMAL LOW (ref 26.0–34.0)
MCHC: 30.9 g/dL (ref 30.0–36.0)
MCV: 73.9 fL — ABNORMAL LOW (ref 80.0–100.0)
Platelets: 236 10*3/uL (ref 150–400)
RBC: 4.72 MIL/uL (ref 4.22–5.81)
RDW: 16.6 % — ABNORMAL HIGH (ref 11.5–15.5)
WBC: 12.3 10*3/uL — ABNORMAL HIGH (ref 4.0–10.5)
nRBC: 0 % (ref 0.0–0.2)

## 2019-03-04 LAB — GLUCOSE, CAPILLARY
Glucose-Capillary: 150 mg/dL — ABNORMAL HIGH (ref 70–99)
Glucose-Capillary: 234 mg/dL — ABNORMAL HIGH (ref 70–99)
Glucose-Capillary: 182 mg/dL — ABNORMAL HIGH (ref 70–99)
Glucose-Capillary: 172 mg/dL — ABNORMAL HIGH (ref 70–99)
Glucose-Capillary: 116 mg/dL — ABNORMAL HIGH (ref 70–99)

## 2019-03-04 LAB — MAGNESIUM: Magnesium: 1.2 mg/dL — ABNORMAL LOW (ref 1.7–2.4)

## 2019-03-04 LAB — PSA
Prostatic Specific Antigen: 2.77 ng/mL (ref 0.00–4.00)
Prostatic Specific Antigen: 3.61 ng/mL (ref 0.00–4.00)

## 2019-03-04 LAB — CK: Total CK: 726 U/L — ABNORMAL HIGH (ref 49–397)

## 2019-03-04 LAB — SODIUM, URINE, RANDOM: Sodium, Ur: 120 mmol/L

## 2019-03-04 LAB — BASIC METABOLIC PANEL
Anion gap: 9 (ref 5–15)
BUN: 21 mg/dL (ref 8–23)
CO2: 24 mmol/L (ref 22–32)
Calcium: 13 mg/dL — ABNORMAL HIGH (ref 8.9–10.3)
Chloride: 104 mmol/L (ref 98–111)
Creatinine, Ser: 2.09 mg/dL — ABNORMAL HIGH (ref 0.61–1.24)
GFR calc Af Amer: 35 mL/min — ABNORMAL LOW (ref >60)
GFR calc non Af Amer: 30 mL/min — ABNORMAL LOW (ref >60)
Glucose, Bld: 98 mg/dL (ref 70–99)
Potassium: 4.3 mmol/L (ref 3.5–5.1)
Sodium: 137 mmol/L (ref 135–145)

## 2019-03-04 LAB — VITAMIN B12: Vitamin B-12: 652 pg/mL (ref 180–914)

## 2019-03-04 LAB — FERRITIN: Ferritin: 13 ng/mL — ABNORMAL LOW (ref 24–336)

## 2019-03-04 LAB — PHOSPHORUS: Phosphorus: 2.1 mg/dL — ABNORMAL LOW (ref 2.5–4.6)

## 2019-03-04 LAB — FOLATE: Folate: 7.6 ng/mL (ref >5.9)

## 2019-03-04 LAB — CREATININE, URINE, RANDOM: Creatinine, Urine: 129.93 mg/dL

## 2019-03-04 LAB — TSH: TSH: 1.342 u[IU]/mL (ref 0.350–4.500)

## 2019-03-04 LAB — LACTATE DEHYDROGENASE: LDH: 154 U/L (ref 98–192)

## 2019-03-04 MED ORDER — ADULT MULTIVITAMIN W/MINERALS CH
1.0000 | ORAL_TABLET | Freq: Every day | ORAL | Status: DC
  Administered 2019-03-04 – 2019-03-08 (×5): 1 via ORAL
  Filled 2019-03-04 (×5): qty 1

## 2019-03-04 MED ORDER — DILTIAZEM HCL-DEXTROSE 125-5 MG/125ML-% IV SOLN (PREMIX)
5.0000 mg/h | INTRAVENOUS | Status: DC
  Administered 2019-03-04: 5 mg/h via INTRAVENOUS
  Filled 2019-03-04: qty 125

## 2019-03-04 MED ORDER — LOPERAMIDE HCL 2 MG PO CAPS
2.0000 mg | ORAL_CAPSULE | ORAL | Status: DC | PRN
  Administered 2019-03-04: 2 mg via ORAL
  Filled 2019-03-04: qty 1

## 2019-03-04 MED ORDER — K PHOS MONO-SOD PHOS DI & MONO 155-852-130 MG PO TABS
500.0000 mg | ORAL_TABLET | Freq: Three times a day (TID) | ORAL | Status: DC
  Administered 2019-03-04 – 2019-03-05 (×4): 500 mg via ORAL
  Filled 2019-03-04 (×6): qty 2

## 2019-03-04 MED ORDER — ENSURE ENLIVE PO LIQD
237.0000 mL | Freq: Two times a day (BID) | ORAL | Status: DC
  Administered 2019-03-04 – 2019-03-08 (×5): 237 mL via ORAL

## 2019-03-04 MED ORDER — CALCITONIN (SALMON) 200 UNIT/ML IJ SOLN
4.0000 [IU]/kg | Freq: Two times a day (BID) | INTRAMUSCULAR | Status: AC
  Administered 2019-03-04 – 2019-03-05 (×3): 390 [IU] via INTRAMUSCULAR
  Filled 2019-03-04 (×5): qty 1.95

## 2019-03-04 MED ORDER — DILTIAZEM HCL ER COATED BEADS 120 MG PO CP24
120.0000 mg | ORAL_CAPSULE | Freq: Every day | ORAL | Status: DC
  Administered 2019-03-04 – 2019-03-08 (×5): 120 mg via ORAL
  Filled 2019-03-04 (×5): qty 1

## 2019-03-04 MED ORDER — MAGNESIUM SULFATE 4 GM/100ML IV SOLN
4.0000 g | Freq: Once | INTRAVENOUS | Status: AC
  Administered 2019-03-04: 4 g via INTRAVENOUS
  Filled 2019-03-04: qty 100

## 2019-03-04 MED ORDER — APIXABAN 5 MG PO TABS
5.0000 mg | ORAL_TABLET | Freq: Two times a day (BID) | ORAL | Status: DC
  Administered 2019-03-04 – 2019-03-05 (×2): 5 mg via ORAL
  Filled 2019-03-04 (×2): qty 1

## 2019-03-04 NOTE — Progress Notes (Signed)
Received a call from [facility], pt converted to NSR. Cardizem PO given, Cardizem IV stopped.

## 2019-03-04 NOTE — Progress Notes (Signed)
Inpatient Diabetes Program Recommendations  AACE/ADA: New Consensus Statement on Inpatient Glycemic Control (2015)  Target Ranges:  Prepandial:   less than 140 mg/dL      Peak postprandial:   less than 180 mg/dL (1-2 hours)      Critically ill patients:  140 - 180 mg/dL   Lab Results  Component Value Date   GLUCAP 182 (H) 03/04/2019   HGBA1C (H) 04/17/2009    7.4 (NOTE) The ADA recommends the following therapeutic goal for glycemic control related to Hgb A1c measurement: Goal of therapy: <6.5 Hgb A1c  Reference: American Diabetes Association: Clinical Practice Recommendations 2010, Diabetes Care, 2010, 33: (Suppl  1).    Review of Glycemic Control  Results for DECKARD, GOHN (MRN [id number]) as of 03/04/2019 14:03  Ref. Range 04/21/2009 11:42 03/03/2019 23:34 03/04/2019 03:49 03/04/2019 07:31 03/04/2019 12:23  Glucose-Capillary Latest Ref Range: 70 - 99 mg/dL 198 (H) 96 150 (H) 234 (H) 182 (H)    Diabetes history: DM2 Outpatient Diabetes medications:  Victoza 1.8 mg QD + Ozempic 1 mg QD + Metformin 1000 mg QD Current orders for Inpatient glycemic control:  Novolog 0-9 units Q4H   Inpatient Diabetes Program Recommendations:     As patient is eating please consider Novolog 0-9 TID with meals and 0-5 QHS  Note: Spoke with patient at bedside because there aren't any diabetes medications list don chart.  He cannot remember which medications he takes. Patient sees Dr. Karlton Lemon and saw him last week.  Medications listed in care everywhere. He checks his blood sugar once a day.  Does not drink any drinks with sugar and tries to watch his CHO intake.  Last A1C found in chart from 2011 was 7.4%.  Current A1C pending. He states his blood sugar usually reads around 140mg /dl.  Denies difficulties obtaining diabetes medications or supplies.  Will continue to follow.  Thank you, Reche Dixon, RN, BSN Diabetes Coordinator Inpatient Diabetes Program [phone] (team pager from 8a-5p)

## 2019-03-04 NOTE — Progress Notes (Signed)
Initial Nutrition Assessment  DOCUMENTATION CODES:   Not applicable  INTERVENTION:    Ensure Enlive po BID, each supplement provides 350 kcal and 20 grams of protein  MVI daily   NUTRITION DIAGNOSIS:   Inadequate oral intake related to poor appetite as evidenced by per patient/family report.  GOAL:   Patient will meet greater than or equal to 90% of their needs  MONITOR:   PO intake, Labs, Supplement acceptance, Weight trends, I & O's  REASON FOR ASSESSMENT:   Consult Assessment of nutrition requirement/status  ASSESSMENT:   Patient with PMH significant for HTN, CKD III, and anemia. Presents this admission with a fib with RVR and AKI.   Pt undergoing nursing care at time of RD visit. Attempted to call later- no answer. Per H&P, pt had poor appetite over the last week and lost 8 lb. Records indicate pt weighed 220 lb on 2/6 and 215 lb this admission (2.3% wt loss in one week, significant for time frame). Unable to diagnose malnutrition without detailed recall or NFPE. No meal completions charted at this time. RD to provide supplements to maximize kcal and protein.   Hypercalcemia and Cr improving with IV fluids. Hgb A1C pending.   I/O: +1,503 ml since admit  UOP: 550 ml x 24 hrs   Medications: SS novolog, K phos Labs: calcium 12.8 (H-albumin wdl) Cr 1.90-trending down Phosphorus 2.1 (L) Mg 1.2 (L) LFTS elevated CBG 96-234   NUTRITION - FOCUSED PHYSICAL EXAM:  Deferred- patient undergoing nursing care at time of visit   Diet Order:   Diet Order            Diet Carb Modified Fluid consistency: Thin; Room service appropriate? Yes  Diet effective now              EDUCATION NEEDS:   Not appropriate for education at this time  Skin:  Skin Assessment: Reviewed RN Assessment  Last BM:  2/10  Height:   Ht Readings from Last 1 Encounters:  03/04/19 6' 2.5" (1.892 m)    Weight:   Wt Readings from Last 1 Encounters:  03/04/19 97.6 kg    Ideal Body  Weight:  89.1 kg  BMI:  Body mass index is 27.25 kg/m.  Estimated Nutritional Needs:   Kcal:  2400-2600 kcal  Protein:  120-135 grams  Fluid:  >/= 2.4 L/day   Mariana Single RD, LDN Clinical Nutrition Pager listed in [place]

## 2019-03-04 NOTE — Consult Note (Signed)
Reason for Consult: Iron deficiency anemia with abnormal weight loss Referring Physician: THP.  Martin Richards is an 79 y.o. male.  HPI: Martin Richards is a 79 year old black male with multiple problems including hypertension/chronic kidney disease/AODM and new's onset atrial fibrillation.  In the ED his labs are significant for hypercalcemia elevated CKs transaminitis hypophosphatemia hypomagnesemia and acute kidney kidney injury with creatinine of 2.5. He was seen in the office yesterday after a hiatus of 3 years when he scheduled his colonoscopy only to cancel it a few days later.  He never rescheduled his procedure thereafter. Yesterday he was emergently worked in at the request of Dr. Karlton Lemon as she had labs done on him that revealed anemia along with guaiac positive stools and he has lost about 36 pounds within the last year. Patient was dizzy and weak when he came to the office but denied having any syncopal or near syncopal events.  In the office we checked orthostatic vital signs and there was evidence of orthostasis. His physical exam was essentially normal with guaiac negative stools.  Patient was complaining of periumbilical pain and change in bowel habits for the last several months with worsening constipation. He denied any history of melena or hematochezia.  According to his wife he is lost about 36 pounds within the last year. She feels his appetite is been poor and he has not been eating as well.  Patient was scheduled for a colonoscopy in March 2015 which she canceled due to scheduling conflict at work but never called back to reschedule his procedure in spite of several reminders. Patient was sent to the hospital by Dr. Karlton Lemon yesterday for IV hydration and was admitted for further work-up.  He claims he feels better today after having several bowel movements and the periumbilical pain has resolved.  He denies having any nausea or vomiting.   Past Medical History:  Diagnosis Date  .  Diabetes mellitus   . Gout   . Hypertension    Past Surgical History:  Procedure Laterality Date  . CHOLECYSTECTOMY     Family History  Problem Relation Age of Onset  . Hypertension Mother   . Hypertension Father   . Cancer Sister   . Diabetes Brother   . Hypertension Brother    Social History:  reports that he has never smoked. He has never used smokeless tobacco. He reports current alcohol use. He reports that he does not use drugs.  Allergies: No Known Allergies  Medications: I have reviewed the patient's current medications.  Results for orders placed or performed during the hospital encounter of 03/03/19 (from the past 48 hour(s))  Lipase, blood     Status: None   Collection Time: 03/03/19  4:44 PM  Result Value Ref Range   Lipase 24 11 - 51 U/L    Comment: Performed at [facility], [place] [address] [place], [place] [address]  Comprehensive metabolic panel     Status: Abnormal   Collection Time: 03/03/19  4:44 PM  Result Value Ref Range   Sodium 135 135 - 145 mmol/L   Potassium 4.1 3.5 - 5.1 mmol/L   Chloride 101 98 - 111 mmol/L   CO2 26 22 - 32 mmol/L   Glucose, Bld 127 (H) 70 - 99 mg/dL   BUN 22 8 - 23 mg/dL   Creatinine, Ser 2.23 (H) 0.61 - 1.24 mg/dL   Calcium 13.6 (HH) 8.9 - 10.3 mg/dL    Comment: CRITICAL RESULT CALLED TO, READ BACK  BY AND VERIFIED WITH: H.DOOLEY,RN [id number] I.MANNING    Total Protein 7.2 6.5 - 8.1 g/dL   Albumin 4.1 3.5 - 5.0 g/dL   AST 58 (H) 15 - 41 U/L   ALT 55 (H) 0 - 44 U/L   Alkaline Phosphatase 77 38 - 126 U/L   Total Bilirubin 0.4 0.3 - 1.2 mg/dL   GFR calc non Af Amer 28 (L) >60 mL/min   GFR calc Af Amer 32 (L) >60 mL/min   Anion gap 8 5 - 15    Comment: Performed at [facility] [address], [place], [place] [address]  CBC     Status: Abnormal   Collection Time: 03/03/19  4:44 PM  Result Value Ref Range   WBC 8.1 4.0 - 10.5 K/uL   RBC 4.56 4.22 - 5.81 MIL/uL   Hemoglobin 10.4 (L) 13.0 - 17.0 g/dL    HCT 34.4 (L) 39.0 - 52.0 %   MCV 75.4 (L) 80.0 - 100.0 fL   MCH 22.8 (L) 26.0 - 34.0 pg   MCHC 30.2 30.0 - 36.0 g/dL   RDW 16.9 (H) 11.5 - 15.5 %   Platelets 225 150 - 400 K/uL    Comment: REPEATED TO VERIFY   nRBC 0.0 0.0 - 0.2 %    Comment: Performed at [facility], [place] [address], [place], [place] [address]  SARS CORONAVIRUS 2 (TAT 6-24 HRS) Nasopharyngeal Nasopharyngeal Swab     Status: None   Collection Time: 03/03/19  7:29 PM   Specimen: Nasopharyngeal Swab  Result Value Ref Range   SARS Coronavirus 2 NEGATIVE NEGATIVE    Comment: (NOTE) SARS-CoV-2 target nucleic acids are NOT DETECTED. The SARS-CoV-2 RNA is generally detectable in upper and lower respiratory specimens during the acute phase of infection. Negative results do not preclude SARS-CoV-2 infection, do not rule out co-infections with other pathogens, and should not be used as the sole basis for treatment or other patient management decisions. Negative results must be combined with clinical observations, patient history, and epidemiological information. The expected result is Negative. Fact Sheet for Patients: SugarRoll.be Fact Sheet for Healthcare Providers: https://www.woods-mathews.com/ This test is not yet approved or cleared by the [place] FDA and  has been authorized for detection and/or diagnosis of SARS-CoV-2 by FDA under an Emergency Use Authorization (EUA). This EUA will remain  in effect (meaning this test can be used) for the duration of the COVID-19 declaration under Section 56 4(b)(1) of the Act, 21 U.S.C. section 360bbb-3(b)(1), unless the authorization is terminated or revoked sooner. Performed at [facility], [place] [address] [place], [place] [address]   Magnesium     Status: Abnormal   Collection Time: 03/03/19  8:25 PM  Result Value Ref Range   Magnesium 1.2 (L) 1.7 - 2.4 mg/dL    Comment: Performed at [facility] [address] [place], [place] [address]  Phosphorus     Status: Abnormal   Collection Time: 03/03/19  8:25 PM  Result Value Ref Range   Phosphorus 2.0 (L) 2.5 - 4.6 mg/dL    Comment: Performed at [facility] [address] [place], [place] [address]  Reticulocytes     Status: None   Collection Time: 03/03/19  8:25 PM  Result Value Ref Range   Retic Ct Pct 1.4 0.4 - 3.1 %   RBC. 4.51 4.22 - 5.81 MIL/uL   Retic Count, Absolute 62.7 19.0 - 186.0 K/uL  Immature Retic Fract 15.0 2.3 - 15.9 %    Comment: Performed at [facility], [place] [address] [place], [place] [address]  CK     Status: Abnormal   Collection Time: 03/03/19  8:25 PM  Result Value Ref Range   Total CK 719 (H) 49 - 397 U/L    Comment: Performed at [facility], [place] [address] [place], [place] [address]  Urinalysis, Routine w reflex microscopic     Status: None   Collection Time: 03/03/19  9:52 PM  Result Value Ref Range   Color, Urine YELLOW YELLOW   APPearance CLEAR CLEAR   Specific Gravity, Urine 1.013 1.005 - 1.030   pH 7.0 5.0 - 8.0   Glucose, UA NEGATIVE NEGATIVE mg/dL   Hgb urine dipstick NEGATIVE NEGATIVE   Bilirubin Urine NEGATIVE NEGATIVE   Ketones, ur NEGATIVE NEGATIVE mg/dL   Protein, ur NEGATIVE NEGATIVE mg/dL   Nitrite NEGATIVE NEGATIVE   Leukocytes,Ua NEGATIVE NEGATIVE    Comment: Performed at [facility] [address], [place], [place] [id number]  Basic metabolic panel     Status: Abnormal   Collection Time: 03/03/19 11:22 PM  Result Value Ref Range   Sodium 137 135 - 145 mmol/L   Potassium 4.3 3.5 - 5.1 mmol/L   Chloride 104 98 - 111 mmol/L   CO2 24 22 - 32 mmol/L   Glucose, Bld 98 70 - 99 mg/dL   BUN 21 8 - 23 mg/dL   Creatinine, Ser 2.09 (H) 0.61 - 1.24 mg/dL   Calcium 13.0 (H) 8.9 - 10.3 mg/dL   GFR calc non Af Amer 30 (L) >60 mL/min   GFR calc Af Amer 35 (L) >60 mL/min   Anion gap 9 5 - 15    Comment: Performed at [facility] [address],  [place], [place] [address]  Lactate dehydrogenase     Status: None   Collection Time: 03/03/19 11:22 PM  Result Value Ref Range   LDH 154 98 - 192 U/L    Comment: Performed at [facility], [place] [address], [place], [place] [address]  Folate     Status: None   Collection Time: 03/03/19 11:22 PM  Result Value Ref Range   Folate 7.6 >5.9 ng/mL    Comment: Performed at [facility] [address] [place], [place] [address]  Ethanol     Status: None   Collection Time: 03/03/19 11:22 PM  Result Value Ref Range   Alcohol, Ethyl (B) <10 <10 mg/dL    Comment: (NOTE) Lowest detectable limit for serum alcohol is 10 mg/dL. For medical purposes only. Performed at [facility], [place] [address], [place], [place] [address]   TSH     Status: None   Collection Time: 03/03/19 11:33 PM  Result Value Ref Range   TSH 1.342 0.350 - 4.500 uIU/mL    Comment: Performed by a 3rd Generation assay with a functional sensitivity of <=0.01 uIU/mL. Performed at [facility], [place] [address], [place], [place] [address]   Vitamin B12     Status: None   Collection Time: 03/03/19 11:33 PM  Result Value Ref Range   Vitamin B-12 652 180 - 914 pg/mL    Comment: (NOTE) This assay is not validated for testing neonatal or myeloproliferative syndrome specimens for Vitamin B12 levels. Performed at [facility], [place] [address] [place], [place] [address]   CBC with Differential/Platelet  Status: Abnormal   Collection Time: 03/03/19 11:33 PM  Result Value Ref Range   WBC 10.2 4.0 - 10.5 K/uL   RBC 4.54 4.22 - 5.81 MIL/uL   Hemoglobin 10.4 (L) 13.0 - 17.0 g/dL   HCT 34.3 (L) 39.0 - 52.0 %   MCV 75.6 (L) 80.0 - 100.0 fL   MCH 22.9 (L) 26.0 - 34.0 pg   MCHC 30.3 30.0 - 36.0 g/dL   RDW 16.9 (H) 11.5 - 15.5 %   Platelets 219 150 - 400 K/uL   nRBC 0.0 0.0 - 0.2 %   Neutrophils Relative % 61 %   Neutro Abs 6.2 1.7 - 7.7 K/uL   Lymphocytes Relative 32 %   Lymphs Abs 3.3 0.7 - 4.0 K/uL    Monocytes Relative 6 %   Monocytes Absolute 0.6 0.1 - 1.0 K/uL   Eosinophils Relative 1 %   Eosinophils Absolute 0.1 0.0 - 0.5 K/uL   Basophils Relative 0 %   Basophils Absolute 0.0 0.0 - 0.1 K/uL   Immature Granulocytes 0 %   Abs Immature Granulocytes 0.02 0.00 - 0.07 K/uL    Comment: Performed at [facility], [address] [place], [place] [address]  Iron and TIBC     Status: Abnormal   Collection Time: 03/03/19 11:33 PM  Result Value Ref Range   Iron 76 45 - 182 ug/dL   TIBC 444 250 - 450 ug/dL   Saturation Ratios 17 (L) 17.9 - 39.5 %   UIBC 368 ug/dL    Comment: Performed at [facility], [place] [address] [place], [place] [address]  Ferritin     Status: Abnormal   Collection Time: 03/03/19 11:33 PM  Result Value Ref Range   Ferritin 13 (L) 24 - 336 ng/mL    Comment: Performed at [facility], [place] [address] [place], [place] [address]  PSA     Status: None   Collection Time: 03/03/19 11:33 PM  Result Value Ref Range   Prostatic Specific Antigen 2.77 0.00 - 4.00 ng/mL    Comment: (NOTE) While PSA levels of <=4.0 ng/ml are reported as reference range, some men with levels below 4.0 ng/ml can have prostate cancer and many men with PSA above 4.0 ng/ml do not have prostate cancer.  Other tests such as free PSA, age specific reference ranges, PSA velocity and PSA doubling time may be helpful especially in men less than 74 years old. Performed at [facility], [place] [address], [place], [place] [address]   Glucose, capillary     Status: None   Collection Time: 03/03/19 11:34 PM  Result Value Ref Range   Glucose-Capillary 96 70 - 99 mg/dL  Glucose, capillary     Status: Abnormal   Collection Time: 03/04/19  3:49 AM  Result Value Ref Range   Glucose-Capillary 150 (H) 70 - 99 mg/dL  Magnesium     Status: Abnormal   Collection Time: 03/04/19  5:50 AM  Result Value Ref Range   Magnesium 1.2 (L) 1.7 - 2.4 mg/dL    Comment: Performed at [facility] [address], [place], [place] [address]  Phosphorus     Status: Abnormal   Collection Time: 03/04/19  5:50 AM  Result Value Ref Range   Phosphorus 2.1 (L) 2.5 - 4.6 mg/dL    Comment: Performed at [facility] [address], [place],  [address]  Comprehensive metabolic panel  Status: Abnormal   Collection Time: 03/04/19  5:50 AM  Result Value Ref Range   Sodium 136 135 - 145 mmol/L   Potassium 3.6 3.5 - 5.1 mmol/L   Chloride 104 98 - 111 mmol/L   CO2 20 (L) 22 - 32 mmol/L   Glucose, Bld 215 (H) 70 - 99 mg/dL   BUN 18 8 - 23 mg/dL   Creatinine, Ser 1.90 (H) 0.61 - 1.24 mg/dL   Calcium 12.8 (H) 8.9 - 10.3 mg/dL   Total Protein 7.3 6.5 - 8.1 g/dL   Albumin 3.9 3.5 - 5.0 g/dL   AST 59 (H) 15 - 41 U/L   ALT 55 (H) 0 - 44 U/L   Alkaline Phosphatase 78 38 - 126 U/L   Total Bilirubin 0.7 0.3 - 1.2 mg/dL   GFR calc non Af Amer 33 (L) >60 mL/min   GFR calc Af Amer 39 (L) >60 mL/min   Anion gap 12 5 - 15    Comment: Performed at [facility], [place] [address], [place], [place] [address]  CBC     Status: Abnormal   Collection Time: 03/04/19  5:50 AM  Result Value Ref Range   WBC 12.3 (H) 4.0 - 10.5 K/uL   RBC 4.72 4.22 - 5.81 MIL/uL   Hemoglobin 10.8 (L) 13.0 - 17.0 g/dL   HCT 34.9 (L) 39.0 - 52.0 %   MCV 73.9 (L) 80.0 - 100.0 fL   MCH 22.9 (L) 26.0 - 34.0 pg   MCHC 30.9 30.0 - 36.0 g/dL   RDW 16.6 (H) 11.5 - 15.5 %   Platelets 236 150 - 400 K/uL    Comment: REPEATED TO VERIFY   nRBC 0.0 0.0 - 0.2 %    Comment: Performed at [facility], [place] [address], [place], [place] [address]  CK     Status: Abnormal   Collection Time: 03/04/19  5:50 AM  Result Value Ref Range   Total CK 726 (H) 49 - 397 U/L    Comment: Performed at [facility], [place] [address], [place], [place] [address]  Glucose, capillary     Status: Abnormal   Collection Time: 03/04/19  7:31 AM  Result Value Ref Range   Glucose-Capillary 234 (H) 70 - 99 mg/dL   DG Chest 2  View  Result Date: 03/03/2019 CLINICAL DATA:  Weakness. Dehydration. EXAM: CHEST - 2 VIEW COMPARISON:  Chest radiograph 02/27/2018 FINDINGS: The cardiomediastinal contours are normal. The lungs are clear. Pulmonary vasculature is normal. No consolidation, pleural effusion, or pneumothorax. No acute osseous abnormalities are seen. Degenerative change in the spine. IMPRESSION: No acute chest findings. Electronically Signed   By: Keith Rake M.D.   On: 03/03/2019 20:36   Review of Systems Blood pressure 128/79, pulse 87, temperature 98.5 F (36.9 C), temperature source Oral, resp. rate 20, height 6' 2.5" (1.892 m), weight 97.6 kg, SpO2 97 %. Physical Exam  Constitutional: He is oriented to person, place, and time. He appears well-developed and well-nourished.  HENT:  Head: Normocephalic and atraumatic.  Eyes: Pupils are equal, round, and reactive to light. Conjunctivae and EOM are normal.  Cardiovascular: An irregularly irregular rhythm present.  Respiratory: Effort normal and breath sounds normal.  GI: Soft. Bowel sounds are normal. He exhibits no distension and no mass. There is no abdominal tenderness. There is no rebound and no guarding.  Musculoskeletal:        General: Normal range of motion.  Cervical back: Normal range of motion and neck supple.  Neurological: He is alert and oriented to person, place, and time.  Skin: Skin is warm and dry.  Psychiatric: He has a normal mood and affect. His behavior is normal. Judgment and thought content normal.   Assessment/Plan: 1) Iron deficiency anemia [he is ferritin was low at 13 with iron of 76 and a folate of 7.6] with guaiac positive stools noted by his PCP change in bowel habits with worsening constipation and abnormal weight loss; of note he was guaiac negative yesterday on my exam-plans are to do an EGD and colonoscopy when his once his acute cardiac problems stabilize. Patient has been very noncompliant with his care for several years  but has agreed to do the prep for the procedures once his A. fib has been controlled. 2) Elevated AST & ALT 59/55-etiology unclear at this time; will monitor closely 3) New onset paroxysmal atrial fibrillation with rapid ventricular response on Cardizem drip. 4) AKI superimposed on chronic kidney disease.  Baseline creatinine around 1.6 given his creatinine in the ER was 2.5 renal function seems to be improving.  with IV fluids. 5) Hypercalcemia improving with IV fluids; patient been started on calcitonin 6) AODM. 7) Hypomagness anemia and hypophosphatemia-being supplemented  Juanita Craver 03/04/2019, 9:34 AM

## 2019-03-04 NOTE — Progress Notes (Signed)
New Admission Note:  Arrival Method: Wheelchair Mental Orientation: Alert and oriented x 4 Telemetry: Box 22 Assessment: Completed Skin: Warm and dry IV: NSL Pain: Denies Tubes: N/A Safety Measures: Safety Fall Prevention Plan initiated.  Admission: Completed 5 M  Orientation: Patient has been orientated to the room, unit and the staff. Welcome booklet given.  Family: None  Orders have been reviewed and implemented. Will continue to monitor the patient. Call light has been placed within reach and bed alarm has been activated.   Sima Matas BSN, RN  Phone Number: [phone]

## 2019-03-04 NOTE — Progress Notes (Signed)
Patient with new onset of A-fib. Dr. Tawanna Solo paged to make aware of A-fib with RVR. On assessment patients reports no symptoms. Waiting for orders, will continue to monitor

## 2019-03-04 NOTE — Progress Notes (Signed)
New onset Afib. HR sustaining at 120 to 130's. Occasional RVR at 140's. Patient assymptomatic. Will update MD on call.

## 2019-03-04 NOTE — Evaluation (Signed)
Physical Therapy Evaluation Patient Details Name: Martin Richards MRN: [id number] DOB: 07/15/1940 Today's Date: 03/04/2019   History of Present Illness  Pt is a 79 y.o. M with significant PMH of CKD, HTN, DM who presents from PCP with orthostatic hypotension and labs consistent with dehydration. Admitted with hypercalcemia, elevated CK, hypomagnesemia, and AKI; also developed new onset a. fib.  Clinical Impression  Prior to admission, pt lives with his wife and works as a Public house manager. On PT evaluation, pt presents with decreased cardiovascular endurance and decreased gait speed. Ambulating 350 feet with no assistive device, HR peak 92 bpm, BP 164/75 post mobility. Pt reports he feels weaker than baseline. Will continue to follow acutely to progress mobility as tolerated. No PT follow up anticipated.     Follow Up Recommendations No PT follow up    Equipment Recommendations  None recommended by PT    Recommendations for Other Services       Precautions / Restrictions Precautions Precautions: Fall Restrictions Weight Bearing Restrictions: No      Mobility  Bed Mobility Overal bed mobility: Independent                Transfers Overall transfer level: Independent Equipment used: None                Ambulation/Gait Ambulation/Gait assistance: Counsellor (Feet): 350 Feet Assistive device: None Gait Pattern/deviations: Step-through pattern;Decreased stride length;Decreased dorsiflexion - right;Decreased dorsiflexion - left Gait velocity: decreased   General Gait Details: Pt with decreased bilateral heel strike at initial contact (likely due to tight gastrocs); no gross unsteadiness; slow speed  Stairs            Wheelchair Mobility    Modified Rankin (Stroke Patients Only)       Balance Overall balance assessment: Mild deficits observed, not formally tested                                           Pertinent  Vitals/Pain Pain Assessment: No/denies pain    Home Living Family/patient expects to be discharged to:: Private residence Living Arrangements: Spouse/significant other Available Help at Discharge: Family Type of Home: House Home Access: Stairs to enter   Technical brewer of Steps: 2 Home Layout: Two level Home Equipment: None      Prior Function Level of Independence: Independent         Comments: Works as tax Comptroller        Extremity/Trunk Assessment   Upper Extremity Assessment Upper Extremity Assessment: Overall WFL for tasks assessed    Lower Extremity Assessment Lower Extremity Assessment: Overall WFL for tasks assessed       Communication   Communication: No difficulties  Cognition Arousal/Alertness: Awake/alert Behavior During Therapy: WFL for tasks assessed/performed Overall Cognitive Status: Within Functional Limits for tasks assessed                                        General Comments      Exercises     Assessment/Plan    PT Assessment Patient needs continued PT services  PT Problem List Decreased activity tolerance;Decreased balance;Decreased mobility       PT Treatment Interventions Gait training;Stair training;Functional mobility training;Therapeutic activities;Therapeutic exercise;Balance training;Patient/family education  PT Goals (Current goals can be found in the Care Plan section)  Acute Rehab PT Goals Patient Stated Goal: none stated; agreeable to therapy PT Goal Formulation: With patient Time For Goal Achievement: 03/18/19 Potential to Achieve Goals: Good    Frequency Min 3X/week   Barriers to discharge        Co-evaluation               AM-PAC PT "6 Clicks" Mobility  Outcome Measure Help needed turning from your back to your side while in a flat bed without using bedrails?: None Help needed moving from lying on your back to sitting on the side of a flat bed  without using bedrails?: None Help needed moving to and from a bed to a chair (including a wheelchair)?: None Help needed standing up from a chair using your arms (e.g., wheelchair or bedside chair)?: None Help needed to walk in hospital room?: A Little Help needed climbing 3-5 steps with a railing? : A Little 6 Click Score: 22    End of Session   Activity Tolerance: Patient tolerated treatment well Patient left: in bed;with call bell/phone within reach   PT Visit Diagnosis: History of falling (Z91.81);Difficulty in walking, not elsewhere classified (R26.2)    Time: [id number] PT Time Calculation (min) (ACUTE ONLY): 16 min   Charges:   PT Evaluation $PT Eval Moderate Complexity: 1 Mod            Wyona Almas, PT, DPT Acute Rehabilitation Services Pager [phone] Office [phone]   Deno Etienne 03/04/2019, 5:02 PM

## 2019-03-04 NOTE — Progress Notes (Signed)
PROGRESS NOTE    Martin Richards  [id number] DOB: 10-Jul-1940 DOA: 03/03/2019 PCP: Patient, No Pcp Per   Brief Narrative: Patient is a 79 year old male with history of hypertension, CKD stage IIIb, hypertension, anemia who was sent by his PCP the emergency department after he was found to be orthostatic, dehydrated.  It was reported that patient was not drinking or eating well since last few days.  He also lost 8 pounds of weight over the last week.  Also complained of some abdominal pain.  On presentation he was found to have hypercalcemia, AKI with creatinine of 2.5, hypophosphatemia, elevated CK.  This morning he went into A. fib with RVR which is a new finding.  Cardiology consulted.  Assessment & Plan:   Active Problems:   Dehydration   Acute on chronic renal failure (HCC)   Hypercalcemia   Elevated LFTs   Rhabdomyolysis   Hypomagnesemia   Low phosphate levels   Essential hypertension   DM (diabetes mellitus), type 2 with complications (HCC)   CKD (chronic kidney disease), stage III   Paroxysmal A. fib with RVR: New onset.CHA2DS2VASc score of 4.  Echocardiogram done this morning showed normal left ventricular ejection fraction, mild left ventricular hypertrophy, no obvious valvular abnormalities.  We have requested for cardiology evaluation.  Not started on anticoagulation yet.  Started on diltiazem drip with rate control.  Hypercalcemia: Report of not eating or drinking well at home with severe loss of appetite.  Could be associated with dehydration.  Calcium level improving with IV fluids.  Also started on calcitonin.  TSH is normal.  PTH pending. No history of smoking, chronic alcohol abuse.  No history of cancer in the family or himself.  Will check PSA.he follows with urology for BPH.  Hypomagnesemia/hypophosphatemia: Continue supplementation  Hypertension: Currently blood pressure stable.He was on diovan at home.  AKI on CKD stage IIIb: Baseline creatinine around 1.6.   Presented with creatinine of 2.5.  Kidney function improving with IV fluids.He was taking diovan at home.  Diabetes type 2: Continue sliding scale insulin for now.  Elevated LFTs/CK : report of falling at home.  Continue IV fluids.   Microcytic anemia: Iron panel shows normal iron, low ferritin.  Currently H&H stable.  Deconditioning/fall: We will request for physical therapy evaluation          DVT prophylaxis:Lovenox Code Status: Full Family Communication: Discussed with wife on phone on 03/04/19 Disposition Plan: Patient is from home.  He is not stable for discharge yet because of acute kidney injury, hypercalcemia, A. fib with RVR.  Expected discharge will be to home.  Waiting for physical therapy evaluation.   Consultants: cardiology  Procedures:None  Antimicrobials:  Anti-infectives (From admission, onward)   None      Subjective:  Patient seen and examined at the bedside this morning.  He was sitting on the chair.  He was in A. fib with RVR but denied any chest pain or shortness of breath.  Appears weak. Objective: Vitals:   03/04/19 1147 03/04/19 1227 03/04/19 1234 03/04/19 1249  BP: 124/80 (!) 151/93 (!) 159/83 135/74  Pulse:  88    Resp:      Temp:  (!) 97.5 F (36.4 C)    TempSrc:  Oral    SpO2:  98%    Weight:      Height:        Intake/Output Summary (Last 24 hours) at 03/04/2019 1329 Last data filed at 03/04/2019 1220 Gross per 24 hour  Intake 2202.91 ml  Output 700 ml  Net 1502.91 ml   Filed Weights   03/04/19 0111  Weight: 97.6 kg    Examination:  General exam: Generalized weakness  HEENT:PERRL,Oral mucosa moist, Ear/Nose normal on gross exam Respiratory system: Bilateral equal air entry, normal vesicular breath sounds, no wheezes or crackles  Cardiovascular system: A. fib with RVR. No JVD, murmurs, rubs, gallops or clicks. No pedal edema. Gastrointestinal system: Abdomen is nondistended, soft and nontender. No organomegaly or masses  felt. Normal bowel sounds heard. Central nervous system: Alert and oriented. No focal neurological deficits. Extremities: No edema, no clubbing ,no cyanosis Skin: No rashes, lesions or ulcers,no icterus ,no pallor     Data Reviewed: I have personally reviewed following labs and imaging studies  CBC: Recent Labs  Lab 03/03/19 1644 03/03/19 2333 03/04/19 0550  WBC 8.1 10.2 12.3*  NEUTROABS  --  6.2  --   HGB 10.4* 10.4* 10.8*  HCT 34.4* 34.3* 34.9*  MCV 75.4* 75.6* 73.9*  PLT 225 219 [id number]   Basic Metabolic Panel: Recent Labs  Lab 03/03/19 1644 03/03/19 2025 03/03/19 2322 03/04/19 0550  NA 135  --  137 136  K 4.1  --  4.3 3.6  CL 101  --  104 104  CO2 26  --  24 20*  GLUCOSE 127*  --  98 215*  BUN 22  --  21 18  CREATININE 2.23*  --  2.09* 1.90*  CALCIUM 13.6*  --  13.0* 12.8*  MG  --  1.2*  --  1.2*  PHOS  --  2.0*  --  2.1*   GFR: Estimated Creatinine Clearance: 39 mL/min (A) (by C-G formula based on SCr of 1.9 mg/dL (H)). Liver Function Tests: Recent Labs  Lab 03/03/19 1644 03/04/19 0550  AST 58* 59*  ALT 55* 55*  ALKPHOS 77 78  BILITOT 0.4 0.7  PROT 7.2 7.3  ALBUMIN 4.1 3.9   Recent Labs  Lab 03/03/19 1644  LIPASE 24   No results for input(s): AMMONIA in the last 168 hours. Coagulation Profile: No results for input(s): INR, PROTIME in the last 168 hours. Cardiac Enzymes: Recent Labs  Lab 03/03/19 2025 03/04/19 0550  CKTOTAL 719* 726*   BNP (last 3 results) No results for input(s): PROBNP in the last 8760 hours. HbA1C: No results for input(s): HGBA1C in the last 72 hours. CBG: Recent Labs  Lab 03/03/19 2334 03/04/19 0349 03/04/19 0731 03/04/19 1223  GLUCAP 96 150* 234* 182*   Lipid Profile: No results for input(s): CHOL, HDL, LDLCALC, TRIG, CHOLHDL, LDLDIRECT in the last 72 hours. Thyroid Function Tests: Recent Labs    03/03/19 2333  TSH 1.342   Anemia Panel: Recent Labs    03/03/19 2025 03/03/19 2322 03/03/19 2333    VITAMINB12  --   --  652  FOLATE  --  7.6  --   FERRITIN  --   --  13*  TIBC  --   --  444  IRON  --   --  76  RETICCTPCT 1.4  --   --    Sepsis Labs: No results for input(s): PROCALCITON, LATICACIDVEN in the last 168 hours.  Recent Results (from the past 240 hour(s))  SARS CORONAVIRUS 2 (TAT 6-24 HRS) Nasopharyngeal Nasopharyngeal Swab     Status: None   Collection Time: 03/03/19  7:29 PM   Specimen: Nasopharyngeal Swab  Result Value Ref Range Status   SARS Coronavirus 2 NEGATIVE NEGATIVE Final  Comment: (NOTE) SARS-CoV-2 target nucleic acids are NOT DETECTED. The SARS-CoV-2 RNA is generally detectable in upper and lower respiratory specimens during the acute phase of infection. Negative results do not preclude SARS-CoV-2 infection, do not rule out co-infections with other pathogens, and should not be used as the sole basis for treatment or other patient management decisions. Negative results must be combined with clinical observations, patient history, and epidemiological information. The expected result is Negative. Fact Sheet for Patients: SugarRoll.be Fact Sheet for Healthcare Providers: https://www.woods-mathews.com/ This test is not yet approved or cleared by the [place] FDA and  has been authorized for detection and/or diagnosis of SARS-CoV-2 by FDA under an Emergency Use Authorization (EUA). This EUA will remain  in effect (meaning this test can be used) for the duration of the COVID-19 declaration under Section 56 4(b)(1) of the Act, 21 U.S.C. section 360bbb-3(b)(1), unless the authorization is terminated or revoked sooner. Performed at [facility], [place] [address], [place], [place] [address]          Radiology Studies: DG Chest 2 View  Result Date: 03/03/2019 CLINICAL DATA:  Weakness. Dehydration. EXAM: CHEST - 2 VIEW COMPARISON:  Chest radiograph 02/27/2018 FINDINGS: The cardiomediastinal contours are  normal. The lungs are clear. Pulmonary vasculature is normal. No consolidation, pleural effusion, or pneumothorax. No acute osseous abnormalities are seen. Degenerative change in the spine. IMPRESSION: No acute chest findings. Electronically Signed   By: Keith Rake M.D.   On: 03/03/2019 20:36   ECHOCARDIOGRAM COMPLETE  Result Date: 03/04/2019    ECHOCARDIOGRAM REPORT   Patient Name:   JAREK BUECKER Date of Exam: 03/04/2019 Medical Rec #:  [id number]         Height:       74.5 in Accession #:    [id number]        Weight:       215.1 lb Date of Birth:  10/19/1940        BSA:          2.25 m Patient Age:    82 years          BP:           128/79 mmHg Patient Gender: M                 HR:           96 bpm. Exam Location:  Inpatient Procedure: 2D Echo, Cardiac Doppler and Color Doppler Indications:     Dyspnea 786.09  History:         Patient has no prior history of Echocardiogram examinations.                  Risk Factors:Hypertension, Diabetes and Non-Smoker.  Sonographer:     Vickie Epley RDCS Referring Phys:  [id number] Anitra Doxtater Diagnosing Phys: Fransico Him MD IMPRESSIONS  1. Left ventricular ejection fraction, by estimation, is 65 to 70%. The left ventricle has normal function. The left ventrical has no regional wall motion abnormalities. There is moderately increased left ventricular hypertrophy of the basal-septal segment. Left ventricular diastolic function could not be evaluated secondary to atrial fibrillation.  2. Right ventricular systolic function is normal. The right ventricular size is normal. Tricuspid regurgitation signal is inadequate for assessing PA pressure.  3. The mitral valve is normal in structure and function. trivial mitral valve regurgitation. No evidence of mitral stenosis.  4. The aortic valve is tricuspid. Aortic valve regurgitation is not visualized. Mild aortic valve  sclerosis is present, with no evidence of aortic valve stenosis.  5. The inferior vena cava is dilated in  size with <50% respiratory variability, suggesting right atrial pressure of 15 mmHg. FINDINGS  Left Ventricle: Left ventricular ejection fraction, by estimation, is 65 to 70%. The left ventricle has normal function. The left ventricle has no regional wall motion abnormalities. The left ventricular internal cavity size was normal in size. There is  moderately increased left ventricular hypertrophy of the basal-septal segment. The left ventricular diastology could not be evaluated due to atrial fibrillation. Left ventricular diastolic function could not be evaluated. Right Ventricle: The right ventricular size is normal. No increase in right ventricular wall thickness. Right ventricular systolic function is normal. Tricuspid regurgitation signal is inadequate for assessing PA pressure. Left Atrium: Left atrial size was normal in size. Right Atrium: Right atrial size was normal in size. Pericardium: There is no evidence of pericardial effusion. Mitral Valve: The mitral valve is normal in structure and function. Normal mobility of the mitral valve leaflets. Trivial mitral valve regurgitation. No evidence of mitral valve stenosis. Tricuspid Valve: The tricuspid valve is normal in structure. Tricuspid valve regurgitation is not demonstrated. No evidence of tricuspid stenosis. Aortic Valve: The aortic valve is tricuspid. Aortic valve regurgitation is not visualized. Mild aortic valve sclerosis is present, with no evidence of aortic valve stenosis. Pulmonic Valve: The pulmonic valve was normal in structure. Pulmonic valve regurgitation is trivial. No evidence of pulmonic stenosis. Aorta: The aortic root is normal in size and structure. Venous: The inferior vena cava is dilated in size with less than 50% respiratory variability, suggesting right atrial pressure of 15 mmHg. The inferior vena cava and the hepatic vein show a normal flow pattern. IAS/Shunts: No atrial level shunt detected by color flow Doppler.  LEFT VENTRICLE  PLAX 2D LVIDd:         3.90 cm LVIDs:         2.70 cm LV PW:         1.10 cm LV IVS:        1.30 cm LVOT diam:     2.00 cm LV SV:         54.66 ml LV SV Index:   17.08 LVOT Area:     3.14 cm  RIGHT VENTRICLE TAPSE (M-mode): 1.6 cm LEFT ATRIUM             Index       RIGHT ATRIUM           Index LA diam:        4.10 cm 1.82 cm/m  RA Area:     11.80 cm LA Vol (A2C):   40.8 ml 18.12 ml/m RA Volume:   23.60 ml  10.48 ml/m LA Vol (A4C):   45.5 ml 20.20 ml/m LA Biplane Vol: 43.9 ml 19.49 ml/m  AORTIC VALVE LVOT Vmax:   96.70 cm/s LVOT Vmean:  70.800 cm/s LVOT VTI:    0.174 m  AORTA Ao Root diam: 3.30 cm  SHUNTS Systemic VTI:  0.17 m Systemic Diam: 2.00 cm Fransico Him MD Electronically signed by Fransico Him MD Signature Date/Time: 03/04/2019/10:36:09 AM    Final (Updated)         Scheduled Meds: . calcitonin  4 Units/kg Intramuscular BID  . enoxaparin (LOVENOX) injection  30 mg Subcutaneous Q24H  . insulin aspart  0-9 Units Subcutaneous Q4H  . phosphorus  500 mg Oral TID  . sodium chloride flush  3 mL Intravenous Once  Continuous Infusions: . diltiazem (CARDIZEM) infusion 5 mg/hr (03/04/19 1114)     LOS: 1 day    Time spent: 35 mins,More than 50% of that time was spent in counseling and/or coordination of care.      Shelly Coss, MD [facility] P2/10/2019, 1:29 PM

## 2019-03-04 NOTE — Progress Notes (Addendum)
Report called to [facility]. Nurse made aware of Cardizem gtt not started. Magnesium infusing per order. Pt transfer via bed.

## 2019-03-04 NOTE — Consult Note (Addendum)
Cardiology Consultation:   Patient ID: Martin Richards MRN: [id number]; DOB: August 28, 1940  Admit date: 03/03/2019 Date of Consult: 03/04/2019  Primary Care Provider: Patient, No Pcp Per Primary Cardiologist: No primary care provider on file.  Primary Electrophysiologist:  None    Patient Profile:   Martin Richards is a 79 y.o. male with a hx of CKD, HTN, DM  who is being seen today for the evaluation of new-onset afeb at the request of Dr. Tawanna Solo.  History of Present Illness:   Martin Richards is a 79 y/o gentleman who was sent to the ED by his PCP after he was found to be orthostatic and labs consistent with dehydration. In the ED, labs were significant for hypercalcemia, elevated CK, transaminitis, hypomagnesemia, hypophosphatemia, and AKI with creatinine of 2.5 (baseline of around 1.6).  He was admitted for IVF and electrolyte repletion.  The morning following admission, he went into afib with RVR. No history of afib previously.   On my evaluation, Martin Richards states he is feeling much better compared to when he came in yesterday. He had some shortness of breath during when he first went into afib which has now resolved.   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 He admits to minimal PO intake the last week or so. He went to his doctor's office yesterday feeling very weak and was sent to the ED as mentioned above.   Also notes for the last month, he has felt his heart racing with associated weakness in the mornings, but usually resolves by mid-morning. Denies syncope, chest pain, n/v. No history of bleeding issues.   Heart Pathway Score:     Past Medical History:  Diagnosis Date  . Diabetes mellitus   . Gout   . Hypertension     Past Surgical History:  Procedure Laterality Date  . CHOLECYSTECTOMY       Home Medications:  Prior to Admission medications   Medication Sig Start Date End Date Taking? Authorizing Provider  allopurinol  (ZYLOPRIM) 100 MG tablet Take 100 mg by mouth daily.   Yes [provider]  Multiple Vitamin (MULITIVITAMIN WITH MINERALS) TABS Take 1 tablet by mouth daily.   Yes [provider]  Valsartan-Hydrochlorothiazide (DIOVAN HCT PO) Take 1 tablet by mouth daily.   Yes [provider]  dextromethorphan-guaiFENesin (MUCINEX DM) 30-600 MG 12hr tablet Take 1 tablet by mouth 2 (two) times daily. Patient not taking: Reported on 03/03/2019 02/27/18   Fredia Sorrow, MD  oseltamivir (TAMIFLU) 75 MG capsule Take 1 capsule (75 mg total) by mouth every 12 (twelve) hours. Patient not taking: Reported on 03/03/2019 02/27/18   Fredia Sorrow, MD    Inpatient Medications: Scheduled Meds: . calcitonin  4 Units/kg Intramuscular BID  . enoxaparin (LOVENOX) injection  30 mg Subcutaneous Q24H  . insulin aspart  0-9 Units Subcutaneous Q4H  . phosphorus  500 mg Oral TID  . sodium chloride flush  3 mL Intravenous Once   Continuous Infusions: . diltiazem (CARDIZEM) infusion 5 mg/hr (03/04/19 1114)   PRN Meds: acetaminophen **OR** acetaminophen, HYDROcodone-acetaminophen, ondansetron **OR** ondansetron (ZOFRAN) IV  Allergies:   No Known Allergies  Social History:   Social History   Socioeconomic History  . Marital status: Married    Spouse name: Not on file  . Number of children: Not on file  . Years of education: Not on file  . Highest education level: Not on file  Occupational History  . Not on file  Tobacco Use  . Smoking status: Never Smoker  . Smokeless tobacco: Never Used  Substance and Sexual Activity  . Alcohol use: Yes    Comment: rare  . Drug use: No  . Sexual activity: Not on file  Other Topics Concern  . Not on file  Social History Narrative  . Not on file   Social Determinants of Health   Financial Resource Strain:   . Difficulty of Paying Living Expenses: Not on file  Food Insecurity:   . Worried About Charity fundraiser in the Last Year: Not on file  .  Ran Out of Food in the Last Year: Not on file  Transportation Needs:   . Lack of Transportation (Medical): Not on file  . Lack of Transportation (Non-Medical): Not on file  Physical Activity:   . Days of Exercise per Week: Not on file  . Minutes of Exercise per Session: Not on file  Stress:   . Feeling of Stress : Not on file  Social Connections:   . Frequency of Communication with Friends and Family: Not on file  . Frequency of Social Gatherings with Friends and Family: Not on file  . Attends Religious Services: Not on file  . Active Member of Clubs or Organizations: Not on file  . Attends Archivist Meetings: Not on file  . Marital Status: Not on file  Intimate Partner Violence:   . Fear of Current or Ex-Partner: Not on file  . Emotionally Abused: Not on file  . Physically Abused: Not on file  . Sexually Abused: Not on file    Family History:    Family History  Problem Relation Age of Onset  . Hypertension Mother   . Hypertension Father   . Cancer Sister   . Diabetes Brother   . Hypertension Brother      ROS:  Please see the history of present illness.   All other ROS reviewed and negative.     Physical Exam/Data:   Vitals:   03/04/19 1147 03/04/19 1227 03/04/19 1234 03/04/19 1249  BP: 124/80 (!) 151/93 (!) 159/83 135/74  Pulse:  88    Resp:      Temp:  (!) 97.5 F (36.4 C)    TempSrc:  Oral    SpO2:  98%    Weight:      Height:        Intake/Output Summary (Last 24 hours) at 03/04/2019 1340 Last data filed at 03/04/2019 1220 Gross per 24 hour  Intake 2202.91 ml  Output 700 ml  Net 1502.91 ml   Last 3 Weights 03/04/2019 02/27/2018 03/11/2011  Weight (lbs) 215 lb 1.6 oz 220 lb 235 lb  Weight (kg) 97.569 kg 99.791 kg 106.595 kg     Body mass index is 27.25 kg/m.  General:  Well nourished, well developed, in no acute distress HEENT: normal Lymph: no adenopathy Neck: no JVD Endocrine:  No thryomegaly Vascular: No carotid bruits; FA pulses 2+  bilaterally without bruits  Cardiac:  normal S1, S2; RRR; no murmur  Lungs:  clear to auscultation bilaterally, no wheezing, rhonchi or rales  Abd: soft, nontender, no hepatomegaly  Ext: no edema Musculoskeletal:  No deformities, BUE and BLE strength normal and equal Skin: warm and dry  Neuro:  CNs 2-12 intact, no focal abnormalities noted Psych:  Normal affect   EKG:  The EKG was personally reviewed and demonstrates:  afib with a rate of 94 Telemetry:  Telemetry was personally reviewed and demonstrates:  NSR with rates in the 60s.   Relevant CV Studies: TTE 03/04/19  1. Left ventricular ejection fraction, by estimation, is 65 to 70%. The  left ventricle has normal function. The left ventrical has no regional  wall motion abnormalities. There is moderately increased left ventricular  hypertrophy of the basal-septal  segment. Left ventricular diastolic function could not be evaluated  secondary to atrial fibrillation.  2. Right ventricular systolic function is normal. The right ventricular  size is normal. Tricuspid regurgitation signal is inadequate for assessing  PA pressure.  3. The mitral valve is normal in structure and function. trivial mitral  valve regurgitation. No evidence of mitral stenosis.  4. The aortic valve is tricuspid. Aortic valve regurgitation is not  visualized. Mild aortic valve sclerosis is present, with no evidence of  aortic valve stenosis.  5. The inferior vena cava is dilated in size with <50% respiratory  variability, suggesting right atrial pressure of 15 mmHg.   Laboratory Data:  High Sensitivity Troponin:  No results for input(s): TROPONINIHS in the last 720 hours.   Chemistry Recent Labs  Lab 03/03/19 1644 03/03/19 2322 03/04/19 0550  NA 135 137 136  K 4.1 4.3 3.6  CL 101 104 104  CO2 26 24 20*  GLUCOSE 127* 98 215*  BUN 22 21 18   CREATININE 2.23* 2.09* 1.90*  CALCIUM 13.6* 13.0* 12.8*  GFRNONAA 28* 30* 33*  GFRAA 32* 35* 39*    ANIONGAP 8 9 12     Recent Labs  Lab 03/03/19 1644 03/04/19 0550  PROT 7.2 7.3  ALBUMIN 4.1 3.9  AST 58* 59*  ALT 55* 55*  ALKPHOS 77 78  BILITOT 0.4 0.7   Hematology Recent Labs  Lab 03/03/19 1644 03/03/19 2025 03/03/19 2333 03/04/19 0550  WBC 8.1  --  10.2 12.3*  RBC 4.56 4.51 4.54 4.72  HGB 10.4*  --  10.4* 10.8*  HCT 34.4*  --  34.3* 34.9*  MCV 75.4*  --  75.6* 73.9*  MCH 22.8*  --  22.9* 22.9*  MCHC 30.2  --  30.3 30.9  RDW 16.9*  --  16.9* 16.6*  PLT 225  --  219 236   BNPNo results for input(s): BNP, PROBNP in the last 168 hours.  DDimer No results for input(s): DDIMER in the last 168 hours.   Radiology/Studies:  DG Chest 2 View  Result Date: 03/03/2019 CLINICAL DATA:  Weakness. Dehydration. EXAM: CHEST - 2 VIEW COMPARISON:  Chest radiograph 02/27/2018 FINDINGS: The cardiomediastinal contours are normal. The lungs are clear. Pulmonary vasculature is normal. No consolidation, pleural effusion, or pneumothorax. No acute osseous abnormalities are seen. Degenerative change in the spine. IMPRESSION: No acute chest findings. Electronically Signed   By: Keith Rake M.D.   On: 03/03/2019 20:36   ECHOCARDIOGRAM COMPLETE  Result Date: 03/04/2019    ECHOCARDIOGRAM REPORT   Patient Name:   Martin Richards Date of Exam: 03/04/2019 Medical Rec #:  [id number]         Height:       74.5 in Accession #:    [id number]        Weight:       215.1 lb Date of Birth:  11/25/1940        BSA:          2.25 m Patient Age:    22 years          BP:           128/79 mmHg Patient Gender:  M                 HR:           96 bpm. Exam Location:  Inpatient Procedure: 2D Echo, Cardiac Doppler and Color Doppler Indications:     Dyspnea 786.09  History:         Patient has no prior history of Echocardiogram examinations.                  Risk Factors:Hypertension, Diabetes and Non-Smoker.  Sonographer:     Vickie Epley RDCS Referring Phys:  [id number] AMRIT ADHIKARI Diagnosing Phys: Fransico Him MD  IMPRESSIONS  1. Left ventricular ejection fraction, by estimation, is 65 to 70%. The left ventricle has normal function. The left ventrical has no regional wall motion abnormalities. There is moderately increased left ventricular hypertrophy of the basal-septal segment. Left ventricular diastolic function could not be evaluated secondary to atrial fibrillation.  2. Right ventricular systolic function is normal. The right ventricular size is normal. Tricuspid regurgitation signal is inadequate for assessing PA pressure.  3. The mitral valve is normal in structure and function. trivial mitral valve regurgitation. No evidence of mitral stenosis.  4. The aortic valve is tricuspid. Aortic valve regurgitation is not visualized. Mild aortic valve sclerosis is present, with no evidence of aortic valve stenosis.  5. The inferior vena cava is dilated in size with <50% respiratory variability, suggesting right atrial pressure of 15 mmHg. FINDINGS  Left Ventricle: Left ventricular ejection fraction, by estimation, is 65 to 70%. The left ventricle has normal function. The left ventricle has no regional wall motion abnormalities. The left ventricular internal cavity size was normal in size. There is  moderately increased left ventricular hypertrophy of the basal-septal segment. The left ventricular diastology could not be evaluated due to atrial fibrillation. Left ventricular diastolic function could not be evaluated. Right Ventricle: The right ventricular size is normal. No increase in right ventricular wall thickness. Right ventricular systolic function is normal. Tricuspid regurgitation signal is inadequate for assessing PA pressure. Left Atrium: Left atrial size was normal in size. Right Atrium: Right atrial size was normal in size. Pericardium: There is no evidence of pericardial effusion. Mitral Valve: The mitral valve is normal in structure and function. Normal mobility of the mitral valve leaflets. Trivial mitral valve  regurgitation. No evidence of mitral valve stenosis. Tricuspid Valve: The tricuspid valve is normal in structure. Tricuspid valve regurgitation is not demonstrated. No evidence of tricuspid stenosis. Aortic Valve: The aortic valve is tricuspid. Aortic valve regurgitation is not visualized. Mild aortic valve sclerosis is present, with no evidence of aortic valve stenosis. Pulmonic Valve: The pulmonic valve was normal in structure. Pulmonic valve regurgitation is trivial. No evidence of pulmonic stenosis. Aorta: The aortic root is normal in size and structure. Venous: The inferior vena cava is dilated in size with less than 50% respiratory variability, suggesting right atrial pressure of 15 mmHg. The inferior vena cava and the hepatic vein show a normal flow pattern. IAS/Shunts: No atrial level shunt detected by color flow Doppler.  LEFT VENTRICLE PLAX 2D LVIDd:         3.90 cm LVIDs:         2.70 cm LV PW:         1.10 cm LV IVS:        1.30 cm LVOT diam:     2.00 cm LV SV:         54.66 ml LV SV Index:  17.08 LVOT Area:     3.14 cm  RIGHT VENTRICLE TAPSE (M-mode): 1.6 cm LEFT ATRIUM             Index       RIGHT ATRIUM           Index LA diam:        4.10 cm 1.82 cm/m  RA Area:     11.80 cm LA Vol (A2C):   40.8 ml 18.12 ml/m RA Volume:   23.60 ml  10.48 ml/m LA Vol (A4C):   45.5 ml 20.20 ml/m LA Biplane Vol: 43.9 ml 19.49 ml/m  AORTIC VALVE LVOT Vmax:   96.70 cm/s LVOT Vmean:  70.800 cm/s LVOT VTI:    0.174 m  AORTA Ao Root diam: 3.30 cm  SHUNTS Systemic VTI:  0.17 m Systemic Diam: 2.00 cm Fransico Him MD Electronically signed by Fransico Him MD Signature Date/Time: 03/04/2019/10:36:09 AM    Final (Updated)        Assessment and Plan:   1. Paroxysmal afib with RVR -placed on dilt gtt; patient self-converted and is now in NSR with rates in 60s -TSH normal. Echo with normal EF; no structural abnormalities  -based on history, patient may have been going in and out of afib for several weeks -CHA2DS2VASc  score of 4. No contraindication to [facility]. Would start on Eliquis 5 mg BID.  -can transition of dilt gtt to PO diltiazem 120 mg    For questions or updates, please contact [place] Please consult [email or web address] for contact info under     Signed, Delice Bison, DO  03/04/2019 1:40 PM   Patient seen and examined.  Agree with above documentation.  Martin Richards is a 79 year old male with a history of diabetes, hypertension, CKD who is being seen today for evaluation of atrial fibrillation at the request of Dr. Tawanna Solo.  He presented to his PCP yesterday and was found to be dehydrated with orthostatic hypotension.  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  Labs in the ED were notable for AKI (creatinine 2.5), hypercalcemia, transaminitis.  He has been receiving IV fluids.  This morning he went into atrial fibrillation with RVR.  No prior diagnosis of AF.  From review of telemetry, rates initially up to the 140s.  He was started on a diltiazem drip at 5 mg/hr, with improvement in rates to 90s.  He subsequently converted back to normal sinus rhythm, currently with rate in 60s.  TTE today showed EF 65 to 70%, basal septal hypertrophy.  For his new onset atrial fibrillation, would stop diltiazem drip and start p.o. diltiazem CD 120 mg daily.  CHADS-VASc 4 (age x2, diabetes, hypertension), would start Eliquis 5 mg twice daily.  For his basal septal hypertrophy, can plan for cardiac MRI as an outpatient if renal function stable to evaluate etiology (amyloidosis vs HCM vs hypertensive heart disease).  Donato Heinz, MD

## 2019-03-04 NOTE — Progress Notes (Signed)
  Echocardiogram 2D Echocardiogram has been performed.  Michiel Cowboy 03/04/2019, 10:29 AM

## 2019-03-04 NOTE — Progress Notes (Addendum)
Dr. Tawanna Solo at bedside, new orders received. Pt will be transfer to 6E23 to continue monitoring.

## 2019-03-05 DIAGNOSIS — I4891 Unspecified atrial fibrillation: Secondary | ICD-10-CM

## 2019-03-05 LAB — CBC WITH DIFFERENTIAL/PLATELET
Abs Immature Granulocytes: 0.02 10*3/uL (ref 0.00–0.07)
Basophils Absolute: 0 10*3/uL (ref 0.0–0.1)
Basophils Relative: 0 %
Eosinophils Absolute: 0.1 10*3/uL (ref 0.0–0.5)
Eosinophils Relative: 1 %
HCT: 33.5 % — ABNORMAL LOW (ref 39.0–52.0)
Hemoglobin: 10.3 g/dL — ABNORMAL LOW (ref 13.0–17.0)
Immature Granulocytes: 0 %
Lymphocytes Relative: 18 %
Lymphs Abs: 1.9 10*3/uL (ref 0.7–4.0)
MCH: 22.5 pg — ABNORMAL LOW (ref 26.0–34.0)
MCHC: 30.7 g/dL (ref 30.0–36.0)
MCV: 73.3 fL — ABNORMAL LOW (ref 80.0–100.0)
Monocytes Absolute: 0.7 10*3/uL (ref 0.1–1.0)
Monocytes Relative: 7 %
Neutro Abs: 7.6 10*3/uL (ref 1.7–7.7)
Neutrophils Relative %: 74 %
Platelets: 237 10*3/uL (ref 150–400)
RBC: 4.57 MIL/uL (ref 4.22–5.81)
RDW: 16.6 % — ABNORMAL HIGH (ref 11.5–15.5)
WBC: 10.2 10*3/uL (ref 4.0–10.5)
nRBC: 0 % (ref 0.0–0.2)

## 2019-03-05 LAB — PROTEIN ELECTROPHORESIS, SERUM
A/G Ratio: 1.3 (ref 0.7–1.7)
Albumin ELP: 4.1 g/dL (ref 2.9–4.4)
Alpha-1-Globulin: 0.2 g/dL (ref 0.0–0.4)
Alpha-2-Globulin: 0.7 g/dL (ref 0.4–1.0)
Beta Globulin: 1.1 g/dL (ref 0.7–1.3)
Gamma Globulin: 1.1 g/dL (ref 0.4–1.8)
Globulin, Total: 3.1 g/dL (ref 2.2–3.9)
Total Protein ELP: 7.2 g/dL (ref 6.0–8.5)

## 2019-03-05 LAB — GLUCOSE, CAPILLARY
Glucose-Capillary: 147 mg/dL — ABNORMAL HIGH (ref 70–99)
Glucose-Capillary: 152 mg/dL — ABNORMAL HIGH (ref 70–99)
Glucose-Capillary: 156 mg/dL — ABNORMAL HIGH (ref 70–99)
Glucose-Capillary: 165 mg/dL — ABNORMAL HIGH (ref 70–99)
Glucose-Capillary: 171 mg/dL — ABNORMAL HIGH (ref 70–99)
Glucose-Capillary: 203 mg/dL — ABNORMAL HIGH (ref 70–99)

## 2019-03-05 LAB — COMPREHENSIVE METABOLIC PANEL
ALT: 47 U/L — ABNORMAL HIGH (ref 0–44)
AST: 49 U/L — ABNORMAL HIGH (ref 15–41)
Albumin: 3.7 g/dL (ref 3.5–5.0)
Alkaline Phosphatase: 77 U/L (ref 38–126)
Anion gap: 9 (ref 5–15)
BUN: 15 mg/dL (ref 8–23)
CO2: 24 mmol/L (ref 22–32)
Calcium: 12.7 mg/dL — ABNORMAL HIGH (ref 8.9–10.3)
Chloride: 105 mmol/L (ref 98–111)
Creatinine, Ser: 1.74 mg/dL — ABNORMAL HIGH (ref 0.61–1.24)
GFR calc Af Amer: 43 mL/min — ABNORMAL LOW (ref >60)
GFR calc non Af Amer: 37 mL/min — ABNORMAL LOW (ref >60)
Glucose, Bld: 163 mg/dL — ABNORMAL HIGH (ref 70–99)
Potassium: 4.1 mmol/L (ref 3.5–5.1)
Sodium: 138 mmol/L (ref 135–145)
Total Bilirubin: 0.7 mg/dL (ref 0.3–1.2)
Total Protein: 6.9 g/dL (ref 6.5–8.1)

## 2019-03-05 LAB — HEMOGLOBIN A1C
Hgb A1c MFr Bld: 7 % — ABNORMAL HIGH (ref 4.8–5.6)
Mean Plasma Glucose: 154 mg/dL

## 2019-03-05 LAB — PHOSPHORUS: Phosphorus: 2.6 mg/dL (ref 2.5–4.6)

## 2019-03-05 LAB — MAGNESIUM: Magnesium: 1.4 mg/dL — ABNORMAL LOW (ref 1.7–2.4)

## 2019-03-05 LAB — CK: Total CK: 719 U/L — ABNORMAL HIGH (ref 49–397)

## 2019-03-05 LAB — PARATHYROID HORMONE, INTACT (NO CA): PTH: 175 pg/mL — ABNORMAL HIGH (ref 15–65)

## 2019-03-05 MED ORDER — SODIUM CHLORIDE 0.9 % IV SOLN: INTRAVENOUS | Status: DC

## 2019-03-05 MED ORDER — MAGNESIUM SULFATE IN D5W 1-5 GM/100ML-% IV SOLN
1.0000 g | Freq: Once | INTRAVENOUS | Status: AC
  Administered 2019-03-05: 1 g via INTRAVENOUS
  Filled 2019-03-05: qty 100

## 2019-03-05 MED ORDER — PEG 3350-KCL-NA BICARB-NACL 420 G PO SOLR
4000.0000 mL | Freq: Once | ORAL | Status: AC
  Administered 2019-03-05: 4000 mL via ORAL
  Filled 2019-03-05 (×2): qty 4000

## 2019-03-05 NOTE — Progress Notes (Addendum)
PROGRESS NOTE    Martin Richards  [id number] DOB: 06-24-1940 DOA: 03/03/2019 PCP: Patient, No Pcp Per   Brief Narrative: Patient is a 79 year old male with history of hypertension, CKD stage IIIb, hypertension, anemia who was sent by his PCP the emergency department after he was found to be orthostatic, dehydrated.  It was reported that patient was not drinking or eating well since last few days.  He also lost 8 pounds of weight over the last week.  Also complained of some abdominal pain.  On presentation he was found to have hypercalcemia, AKI with creatinine of 2.5, hypophosphatemia, elevated CK.  During this hospitalization, he went into A. fib with RVR which is a new finding.  Cardiology consulted. Along with hypercalcemia, he was found to have elevated PTH.  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  We have requested for nuclear parathyroid scan.  Assessment & Plan:   Active Problems:   Dehydration   Acute on chronic renal failure (HCC)   Hypercalcemia   Elevated LFTs   Rhabdomyolysis   Hypomagnesemia   Low phosphate levels   Essential hypertension   DM (diabetes mellitus), type 2 with complications (HCC)   CKD (chronic kidney disease), stage III   Paroxysmal A. fib with RVR: New onset.CHA2DS2VASc score of 4.  Echocardiogram done this morning showed normal left ventricular ejection fraction, mild left ventricular hypertrophy, no obvious valvular abnormalities.  Cardiology following.  He converted to normal sinus rhythm.  On Cardizem for rate control.  Plan is to start on Eliquis after EGD/colonoscopy.  Hypercalcemia: Report of not eating or drinking well at home with severe loss of appetite..  Calcium level improved with IV fluids,calcitonin.  TSH is normal.  PTH is elevated.. No history of smoking, chronic alcohol abuse.  No history of cancer in the family or himself.  Normal PSA.he follows with urology for  BPH. 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  We have requested for nuclear parathyroid scan.  Hypomagnesemia/hypophosphatemia: Supplemented  Hypertension: Currently blood pressure stable.He was on diovan at home.  AKI on CKD stage IIIb: Baseline creatinine around 1.6.  Presented with creatinine of 2.5.  Kidney function improving with IV fluids.He was taking diovan at home.  Diabetes type 2: Continue sliding scale insulin for now.  Elevated LFTs/CK : report of falling at home.  Continue IV fluids.   Microcytic anemia: Iron panel shows normal iron, low ferritin.  Currently H&H stable.  GI planning for EGD/colonoscopy tomorrow.  Deconditioning/fall: requested for physical therapy evaluation   Nutrition Problem: Inadequate oral intake Etiology: poor appetite      DVT prophylaxis:Lovenox Code Status: Full Family Communication: Discussed with wife on phone on 03/04/19 Disposition Plan: Patient is from home.  He is not stable for discharge yet because of acute kidney injury, hypercalcemia, A. fib with RVR.  Expected discharge will be to home.  Waiting for physical therapy evaluation.   Consultants: cardiology  Procedures:None  Antimicrobials:  Anti-infectives (From admission, onward)   None      Subjective:  Patient seen and examined at the bedside this morning.  Hemodynamically stable.  Feels much better.  Denies any new complaints.   Objective: Vitals:   03/04/19 1735 03/04/19 2137 03/05/19 0831 03/05/19 1300  BP: (!) 119/54 138/61 (!) 148/60 (!) 148/68  Pulse: (!) 58 61    Resp:  18    Temp: 97.8 F (36.6 C) 98 F (36.7 C)  TempSrc: Oral Oral    SpO2: 100% 99%    Weight:      Height:        Intake/Output Summary (Last 24 hours) at 03/05/2019 1341 Last data filed at 03/05/2019 1200 Gross per 24 hour  Intake 739.69 ml  Output 300 ml  Net 439.69 ml   Filed Weights   03/04/19 0111  Weight: 97.6 kg     Examination:  General exam: Not in distress, appears comfortable HEENT:PERRL,Oral mucosa moist, Ear/Nose normal on gross exam Respiratory system: Bilateral equal air entry, normal vesicular breath sounds, no wheezes or crackles  Cardiovascular system: S1,S2. No JVD, murmurs, rubs, gallops or clicks. No pedal edema. Gastrointestinal system: Abdomen is nondistended, soft and nontender. No organomegaly or masses felt. Normal bowel sounds heard. Central nervous system: Alert and oriented. No focal neurological deficits. Extremities: No edema, no clubbing ,no cyanosis Skin: No rashes, lesions or ulcers,no icterus ,no pallor     Data Reviewed: I have personally reviewed following labs and imaging studies  CBC: Recent Labs  Lab 03/03/19 1644 03/03/19 2333 03/04/19 0550 03/05/19 0351  WBC 8.1 10.2 12.3* 10.2  NEUTROABS  --  6.2  --  7.6  HGB 10.4* 10.4* 10.8* 10.3*  HCT 34.4* 34.3* 34.9* 33.5*  MCV 75.4* 75.6* 73.9* 73.3*  PLT 225 219 236 [id number]   Basic Metabolic Panel: Recent Labs  Lab 03/03/19 1644 03/03/19 2025 03/03/19 2322 03/04/19 0550 03/05/19 0351  NA 135  --  137 136 138  K 4.1  --  4.3 3.6 4.1  CL 101  --  104 104 105  CO2 26  --  24 20* 24  GLUCOSE 127*  --  98 215* 163*  BUN 22  --  21 18 15   CREATININE 2.23*  --  2.09* 1.90* 1.74*  CALCIUM 13.6*  --  13.0* 12.8* 12.7*  MG  --  1.2*  --  1.2* 1.4*  PHOS  --  2.0*  --  2.1* 2.6   GFR: Estimated Creatinine Clearance: 42.6 mL/min (A) (by C-G formula based on SCr of 1.74 mg/dL (H)). Liver Function Tests: Recent Labs  Lab 03/03/19 1644 03/04/19 0550 03/05/19 0351  AST 58* 59* 49*  ALT 55* 55* 47*  ALKPHOS 77 78 77  BILITOT 0.4 0.7 0.7  PROT 7.2 7.3 6.9  ALBUMIN 4.1 3.9 3.7   Recent Labs  Lab 03/03/19 1644  LIPASE 24   No results for input(s): AMMONIA in the last 168 hours. Coagulation Profile: No results for input(s): INR, PROTIME in the last 168 hours. Cardiac Enzymes: Recent Labs  Lab  03/03/19 2025 03/04/19 0550 03/05/19 0351  CKTOTAL 719* 726* 719*   BNP (last 3 results) No results for input(s): PROBNP in the last 8760 hours. HbA1C: Recent Labs    03/04/19 0550  HGBA1C 7.0*   CBG: Recent Labs  Lab 03/04/19 2134 03/05/19 0020 03/05/19 0449 03/05/19 0749 03/05/19 1052  GLUCAP 116* 156* 171* 165* 203*   Lipid Profile: No results for input(s): CHOL, HDL, LDLCALC, TRIG, CHOLHDL, LDLDIRECT in the last 72 hours. Thyroid Function Tests: Recent Labs    03/03/19 2333  TSH 1.342   Anemia Panel: Recent Labs    03/03/19 2025 03/03/19 2322 03/03/19 2333  VITAMINB12  --   --  652  FOLATE  --  7.6  --   FERRITIN  --   --  13*  TIBC  --   --  444  IRON  --   --  76  RETICCTPCT 1.4  --   --    Sepsis Labs: No results for input(s): PROCALCITON, LATICACIDVEN in the last 168 hours.  Recent Results (from the past 240 hour(s))  SARS CORONAVIRUS 2 (TAT 6-24 HRS) Nasopharyngeal Nasopharyngeal Swab     Status: None   Collection Time: 03/03/19  7:29 PM   Specimen: Nasopharyngeal Swab  Result Value Ref Range Status   SARS Coronavirus 2 NEGATIVE NEGATIVE Final    Comment: (NOTE) SARS-CoV-2 target nucleic acids are NOT DETECTED. The SARS-CoV-2 RNA is generally detectable in upper and lower respiratory specimens during the acute phase of infection. Negative results do not preclude SARS-CoV-2 infection, do not rule out co-infections with other pathogens, and should not be used as the sole basis for treatment or other patient management decisions. Negative results must be combined with clinical observations, patient history, and epidemiological information. The expected result is Negative. Fact Sheet for Patients: SugarRoll.be Fact Sheet for Healthcare Providers: https://www.woods-mathews.com/ This test is not yet approved or cleared by the [place] FDA and  has been authorized for detection and/or diagnosis of  SARS-CoV-2 by FDA under an Emergency Use Authorization (EUA). This EUA will remain  in effect (meaning this test can be used) for the duration of the COVID-19 declaration under Section 56 4(b)(1) of the Act, 21 U.S.C. section 360bbb-3(b)(1), unless the authorization is terminated or revoked sooner. Performed at [facility], [place] [address] [place], [place] [address]          Radiology Studies: DG Chest 2 View  Result Date: 03/03/2019 CLINICAL DATA:  Weakness. Dehydration. EXAM: CHEST - 2 VIEW COMPARISON:  Chest radiograph 02/27/2018 FINDINGS: The cardiomediastinal contours are normal. The lungs are clear. Pulmonary vasculature is normal. No consolidation, pleural effusion, or pneumothorax. No acute osseous abnormalities are seen. Degenerative change in the spine. IMPRESSION: No acute chest findings. Electronically Signed   By: Keith Rake M.D.   On: 03/03/2019 20:36   ECHOCARDIOGRAM COMPLETE  Result Date: 03/04/2019    ECHOCARDIOGRAM REPORT   Patient Name:   Martin Richards Date of Exam: 03/04/2019 Medical Rec #:  [id number]         Height:       74.5 in Accession #:    [id number]        Weight:       215.1 lb Date of Birth:  [birth year]        BSA:          2.25 m Patient Age:    76 years          BP:           128/79 mmHg Patient Gender: M                 HR:           96 bpm. Exam Location:  Inpatient Procedure: 2D Echo, Cardiac Doppler and Color Doppler Indications:     Dyspnea 786.09  History:         Patient has no prior history of Echocardiogram examinations.                  Risk Factors:Hypertension, Diabetes and Non-Smoker.  Sonographer:     Vickie Epley RDCS Referring Phys:  [id number] Jerald Villalona Diagnosing Phys: Fransico Him MD IMPRESSIONS  1. Left ventricular ejection fraction, by estimation, is 65 to 70%. The left ventricle has normal function. The left ventrical has no regional wall motion abnormalities. There is moderately  increased left ventricular hypertrophy of the  basal-septal segment. Left ventricular diastolic function could not be evaluated secondary to atrial fibrillation.  2. Right ventricular systolic function is normal. The right ventricular size is normal. Tricuspid regurgitation signal is inadequate for assessing PA pressure.  3. The mitral valve is normal in structure and function. trivial mitral valve regurgitation. No evidence of mitral stenosis.  4. The aortic valve is tricuspid. Aortic valve regurgitation is not visualized. Mild aortic valve sclerosis is present, with no evidence of aortic valve stenosis.  5. The inferior vena cava is dilated in size with <50% respiratory variability, suggesting right atrial pressure of 15 mmHg. FINDINGS  Left Ventricle: Left ventricular ejection fraction, by estimation, is 65 to 70%. The left ventricle has normal function. The left ventricle has no regional wall motion abnormalities. The left ventricular internal cavity size was normal in size. There is  moderately increased left ventricular hypertrophy of the basal-septal segment. The left ventricular diastology could not be evaluated due to atrial fibrillation. Left ventricular diastolic function could not be evaluated. Right Ventricle: The right ventricular size is normal. No increase in right ventricular wall thickness. Right ventricular systolic function is normal. Tricuspid regurgitation signal is inadequate for assessing PA pressure. Left Atrium: Left atrial size was normal in size. Right Atrium: Right atrial size was normal in size. Pericardium: There is no evidence of pericardial effusion. Mitral Valve: The mitral valve is normal in structure and function. Normal mobility of the mitral valve leaflets. Trivial mitral valve regurgitation. No evidence of mitral valve stenosis. Tricuspid Valve: The tricuspid valve is normal in structure. Tricuspid valve regurgitation is not demonstrated. No evidence of tricuspid stenosis. Aortic Valve: The aortic valve is tricuspid. Aortic  valve regurgitation is not visualized. Mild aortic valve sclerosis is present, with no evidence of aortic valve stenosis. Pulmonic Valve: The pulmonic valve was normal in structure. Pulmonic valve regurgitation is trivial. No evidence of pulmonic stenosis. Aorta: The aortic root is normal in size and structure. Venous: The inferior vena cava is dilated in size with less than 50% respiratory variability, suggesting right atrial pressure of 15 mmHg. The inferior vena cava and the hepatic vein show a normal flow pattern. IAS/Shunts: No atrial level shunt detected by color flow Doppler.  LEFT VENTRICLE PLAX 2D LVIDd:         3.90 cm LVIDs:         2.70 cm LV PW:         1.10 cm LV IVS:        1.30 cm LVOT diam:     2.00 cm LV SV:         54.66 ml LV SV Index:   17.08 LVOT Area:     3.14 cm  RIGHT VENTRICLE TAPSE (M-mode): 1.6 cm LEFT ATRIUM             Index       RIGHT ATRIUM           Index LA diam:        4.10 cm 1.82 cm/m  RA Area:     11.80 cm LA Vol (A2C):   40.8 ml 18.12 ml/m RA Volume:   23.60 ml  10.48 ml/m LA Vol (A4C):   45.5 ml 20.20 ml/m LA Biplane Vol: 43.9 ml 19.49 ml/m  AORTIC VALVE LVOT Vmax:   96.70 cm/s LVOT Vmean:  70.800 cm/s LVOT VTI:    0.174 m  AORTA Ao Root diam: 3.30 cm  SHUNTS Systemic VTI:  0.17 m Systemic Diam: 2.00 cm Fransico Him MD Electronically signed by Fransico Him MD Signature Date/Time: 03/04/2019/10:36:09 AM    Final (Updated)         Scheduled Meds: . calcitonin  4 Units/kg Intramuscular BID  . diltiazem  120 mg Oral Daily  . feeding supplement (ENSURE ENLIVE)  237 mL Oral BID BM  . insulin aspart  0-9 Units Subcutaneous Q4H  . multivitamin with minerals  1 tablet Oral Daily  . sodium chloride flush  3 mL Intravenous Once   Continuous Infusions:    LOS: 2 days    Time spent: 35 mins,More than 50% of that time was spent in counseling and/or coordination of care.      Shelly Coss, MD [facility] P2/11/2019, 1:41 PM

## 2019-03-05 NOTE — H&P (View-Only) (Signed)
Subjective: No complaints.  Feeling well.  Objective: Vital signs in last 24 hours: Temp:  [97.5 F (36.4 C)-98.2 F (36.8 C)] 98 F (36.7 C) (02/10 2137) Pulse Rate:  [58-110] 61 (02/10 2137) Resp:  [14-18] 18 (02/10 2137) BP: (104-164)/(54-93) 138/61 (02/10 2137) SpO2:  [98 %-100 %] 99 % (02/10 2137) Last BM Date: 03/04/19  Intake/Output from previous day: 02/10 0701 - 02/11 0700 In: 1360 [P.O.:1360] Out: 450 [Urine:450] Intake/Output this shift: No intake/output data recorded.  General appearance: alert and no distress GI: soft, non-tender; bowel sounds normal; no masses,  no organomegaly  Lab Results: Recent Labs    03/03/19 2333 03/04/19 0550 03/05/19 0351  WBC 10.2 12.3* 10.2  HGB 10.4* 10.8* 10.3*  HCT 34.3* 34.9* 33.5*  PLT 219 236 237   BMET Recent Labs    03/03/19 2322 03/04/19 0550 03/05/19 0351  NA 137 136 138  K 4.3 3.6 4.1  CL 104 104 105  CO2 24 20* 24  GLUCOSE 98 215* 163*  BUN 21 18 15   CREATININE 2.09* 1.90* 1.74*  CALCIUM 13.0* 12.8* 12.7*   LFT Recent Labs    03/05/19 0351  PROT 6.9  ALBUMIN 3.7  AST 49*  ALT 47*  ALKPHOS 77  BILITOT 0.7   PT/INR No results for input(s): LABPROT, INR in the last 72 hours. Hepatitis Panel No results for input(s): HEPBSAG, HCVAB, HEPAIGM, HEPBIGM in the last 72 hours. C-Diff No results for input(s): CDIFFTOX in the last 72 hours. Fecal Lactopherrin No results for input(s): FECLLACTOFRN in the last 72 hours.  Studies/Results: DG Chest 2 View  Result Date: 03/03/2019 CLINICAL DATA:  Weakness. Dehydration. EXAM: CHEST - 2 VIEW COMPARISON:  Chest radiograph 02/27/2018 FINDINGS: The cardiomediastinal contours are normal. The lungs are clear. Pulmonary vasculature is normal. No consolidation, pleural effusion, or pneumothorax. No acute osseous abnormalities are seen. Degenerative change in the spine. IMPRESSION: No acute chest findings. Electronically Signed   By: Keith Rake M.D.   On:  03/03/2019 20:36   ECHOCARDIOGRAM COMPLETE  Result Date: 03/04/2019    ECHOCARDIOGRAM REPORT   Patient Name:   Martin Richards Date of Exam: 03/04/2019 Medical Rec #:  [id number]         Height:       74.5 in Accession #:    [id number]        Weight:       215.1 lb Date of Birth:  18-Feb-1940        BSA:          2.25 m Patient Age:    79 years          BP:           128/79 mmHg Patient Gender: M                 HR:           96 bpm. Exam Location:  Inpatient Procedure: 2D Echo, Cardiac Doppler and Color Doppler Indications:     Dyspnea 786.09  History:         Patient has no prior history of Echocardiogram examinations.                  Risk Factors:Hypertension, Diabetes and Non-Smoker.  Sonographer:     Vickie Epley RDCS Referring Phys:  [id number] AMRIT ADHIKARI Diagnosing Phys: Fransico Him MD IMPRESSIONS  1. Left ventricular ejection fraction, by estimation, is 65 to 70%. The left ventricle has normal function. The left  ventrical has no regional wall motion abnormalities. There is moderately increased left ventricular hypertrophy of the basal-septal segment. Left ventricular diastolic function could not be evaluated secondary to atrial fibrillation.  2. Right ventricular systolic function is normal. The right ventricular size is normal. Tricuspid regurgitation signal is inadequate for assessing PA pressure.  3. The mitral valve is normal in structure and function. trivial mitral valve regurgitation. No evidence of mitral stenosis.  4. The aortic valve is tricuspid. Aortic valve regurgitation is not visualized. Mild aortic valve sclerosis is present, with no evidence of aortic valve stenosis.  5. The inferior vena cava is dilated in size with <50% respiratory variability, suggesting right atrial pressure of 15 mmHg. FINDINGS  Left Ventricle: Left ventricular ejection fraction, by estimation, is 65 to 70%. The left ventricle has normal function. The left ventricle has no regional wall motion abnormalities. The  left ventricular internal cavity size was normal in size. There is  moderately increased left ventricular hypertrophy of the basal-septal segment. The left ventricular diastology could not be evaluated due to atrial fibrillation. Left ventricular diastolic function could not be evaluated. Right Ventricle: The right ventricular size is normal. No increase in right ventricular wall thickness. Right ventricular systolic function is normal. Tricuspid regurgitation signal is inadequate for assessing PA pressure. Left Atrium: Left atrial size was normal in size. Right Atrium: Right atrial size was normal in size. Pericardium: There is no evidence of pericardial effusion. Mitral Valve: The mitral valve is normal in structure and function. Normal mobility of the mitral valve leaflets. Trivial mitral valve regurgitation. No evidence of mitral valve stenosis. Tricuspid Valve: The tricuspid valve is normal in structure. Tricuspid valve regurgitation is not demonstrated. No evidence of tricuspid stenosis. Aortic Valve: The aortic valve is tricuspid. Aortic valve regurgitation is not visualized. Mild aortic valve sclerosis is present, with no evidence of aortic valve stenosis. Pulmonic Valve: The pulmonic valve was normal in structure. Pulmonic valve regurgitation is trivial. No evidence of pulmonic stenosis. Aorta: The aortic root is normal in size and structure. Venous: The inferior vena cava is dilated in size with less than 50% respiratory variability, suggesting right atrial pressure of 15 mmHg. The inferior vena cava and the hepatic vein show a normal flow pattern. IAS/Shunts: No atrial level shunt detected by color flow Doppler.  LEFT VENTRICLE PLAX 2D LVIDd:         3.90 cm LVIDs:         2.70 cm LV PW:         1.10 cm LV IVS:        1.30 cm LVOT diam:     2.00 cm LV SV:         54.66 ml LV SV Index:   17.08 LVOT Area:     3.14 cm  RIGHT VENTRICLE TAPSE (M-mode): 1.6 cm LEFT ATRIUM             Index       RIGHT ATRIUM            Index LA diam:        4.10 cm 1.82 cm/m  RA Area:     11.80 cm LA Vol (A2C):   40.8 ml 18.12 ml/m RA Volume:   23.60 ml  10.48 ml/m LA Vol (A4C):   45.5 ml 20.20 ml/m LA Biplane Vol: 43.9 ml 19.49 ml/m  AORTIC VALVE LVOT Vmax:   96.70 cm/s LVOT Vmean:  70.800 cm/s LVOT VTI:    0.174 m  AORTA Ao Root diam: 3.30 cm  SHUNTS Systemic VTI:  0.17 m Systemic Diam: 2.00 cm Fransico Him MD Electronically signed by Fransico Him MD Signature Date/Time: 03/04/2019/10:36:09 AM    Final (Updated)     Medications:  Scheduled: . apixaban  5 mg Oral BID  . calcitonin  4 Units/kg Intramuscular BID  . diltiazem  120 mg Oral Daily  . feeding supplement (ENSURE ENLIVE)  237 mL Oral BID BM  . insulin aspart  0-9 Units Subcutaneous Q4H  . multivitamin with minerals  1 tablet Oral Daily  . phosphorus  500 mg Oral TID  . sodium chloride flush  3 mL Intravenous Once   Continuous:   Assessment/Plan: 1) IDA. 2) Heme positive stool. 3) Paroxysmal afib now in NSR.   The patient is in NSR and the plan is for him to be on anticoagulation.  He appears to be stable for GI work up.  Plan: 1) EGD/colonoscopy tomorrow.  LOS: 2 days   Julieann Drummonds D 03/05/2019, 7:06 AM

## 2019-03-05 NOTE — Progress Notes (Addendum)
Progress Note  Patient Name: Martin Richards Date of Encounter: 03/05/2019  Primary Cardiologist: No primary care provider on file.   Subjective   Patient remains in NSR. Plan for colonoscopy tomorrow for heme+ stool.   Inpatient Medications    Scheduled Meds:  apixaban  5 mg Oral BID   calcitonin  4 Units/kg Intramuscular BID   diltiazem  120 mg Oral Daily   feeding supplement (ENSURE ENLIVE)  237 mL Oral BID BM   insulin aspart  0-9 Units Subcutaneous Q4H   multivitamin with minerals  1 tablet Oral Daily   phosphorus  500 mg Oral TID   sodium chloride flush  3 mL Intravenous Once   Continuous Infusions:  PRN Meds: acetaminophen **OR** acetaminophen, HYDROcodone-acetaminophen, loperamide, ondansetron **OR** ondansetron (ZOFRAN) IV   Vital Signs    Vitals:   03/04/19 1543 03/04/19 1649 03/04/19 1735 03/04/19 2137  BP: (!) 164/75 (!) 141/62 (!) 119/54 138/61  Pulse:   (!) 58 61  Resp:    18  Temp:   97.8 F (36.6 C) 98 F (36.7 C)  TempSrc:   Oral Oral  SpO2:   100% 99%  Weight:      Height:        Intake/Output Summary (Last 24 hours) at 03/05/2019 0820 Last data filed at 03/04/2019 2344 Gross per 24 hour  Intake 1360 ml  Output 450 ml  Net 910 ml   Last 3 Weights 03/04/2019 02/27/2018 03/11/2011  Weight (lbs) 215 lb 1.6 oz 220 lb 235 lb  Weight (kg) 97.569 kg 99.791 kg 106.595 kg      Telemetry    NSR, HR 60-70s - Personally Reviewed  ECG    pending - Personally Reviewed  Physical Exam   GEN: No acute distress.   Neck: No JVD Cardiac: RRR, no murmurs, rubs, or gallops.  Respiratory: Clear to auscultation bilaterally. GI: Soft, nontender, non-distended  MS: No edema; No deformity. Neuro:  Nonfocal  Psych: Normal affect   Labs    High Sensitivity Troponin:  No results for input(s): TROPONINIHS in the last 720 hours.    Chemistry Recent Labs  Lab 03/03/19 1644 03/03/19 1644 03/03/19 2322 03/04/19 0550 03/05/19 0351  NA 135    < > 137 136 138  K 4.1   < > 4.3 3.6 4.1  CL 101   < > 104 104 105  CO2 26   < > 24 20* 24  GLUCOSE 127*   < > 98 215* 163*  BUN 22   < > 21 18 15   CREATININE 2.23*   < > 2.09* 1.90* 1.74*  CALCIUM 13.6*   < > 13.0* 12.8* 12.7*  PROT 7.2  --   --  7.3 6.9  ALBUMIN 4.1  --   --  3.9 3.7  AST 58*  --   --  59* 49*  ALT 55*  --   --  55* 47*  ALKPHOS 77  --   --  78 77  BILITOT 0.4  --   --  0.7 0.7  GFRNONAA 28*   < > 30* 33* 37*  GFRAA 32*   < > 35* 39* 43*  ANIONGAP 8   < > 9 12 9    < > = values in this interval not displayed.     Hematology Recent Labs  Lab 03/03/19 2333 03/04/19 0550 03/05/19 0351  WBC 10.2 12.3* 10.2  RBC 4.54 4.72 4.57  HGB 10.4* 10.8* 10.3*  HCT 34.3*  34.9* 33.5*  MCV 75.6* 73.9* 73.3*  MCH 22.9* 22.9* 22.5*  MCHC 30.3 30.9 30.7  RDW 16.9* 16.6* 16.6*  PLT 219 236 237    BNPNo results for input(s): BNP, PROBNP in the last 168 hours.   DDimer No results for input(s): DDIMER in the last 168 hours.   Radiology    DG Chest 2 View  Result Date: 03/03/2019 CLINICAL DATA:  Weakness. Dehydration. EXAM: CHEST - 2 VIEW COMPARISON:  Chest radiograph 02/27/2018 FINDINGS: The cardiomediastinal contours are normal. The lungs are clear. Pulmonary vasculature is normal. No consolidation, pleural effusion, or pneumothorax. No acute osseous abnormalities are seen. Degenerative change in the spine. IMPRESSION: No acute chest findings. Electronically Signed   By: Keith Rake M.D.   On: 03/03/2019 20:36   ECHOCARDIOGRAM COMPLETE  Result Date: 03/04/2019    ECHOCARDIOGRAM REPORT   Patient Name:   Martin Richards Date of Exam: 03/04/2019 Medical Rec #:  [id number]         Height:       74.5 in Accession #:    [id number]        Weight:       215.1 lb Date of Birth:  27-Sep-1940        BSA:          2.25 m Patient Age:    79 years          BP:           128/79 mmHg Patient Gender: M                 HR:           96 bpm. Exam Location:  Inpatient Procedure: 2D Echo,  Cardiac Doppler and Color Doppler Indications:     Dyspnea 786.09  History:         Patient has no prior history of Echocardiogram examinations.                  Risk Factors:Hypertension, Diabetes and Non-Smoker.  Sonographer:     Vickie Epley RDCS Referring Phys:  [id number] AMRIT ADHIKARI Diagnosing Phys: Fransico Him MD IMPRESSIONS  1. Left ventricular ejection fraction, by estimation, is 65 to 70%. The left ventricle has normal function. The left ventrical has no regional wall motion abnormalities. There is moderately increased left ventricular hypertrophy of the basal-septal segment. Left ventricular diastolic function could not be evaluated secondary to atrial fibrillation.  2. Right ventricular systolic function is normal. The right ventricular size is normal. Tricuspid regurgitation signal is inadequate for assessing PA pressure.  3. The mitral valve is normal in structure and function. trivial mitral valve regurgitation. No evidence of mitral stenosis.  4. The aortic valve is tricuspid. Aortic valve regurgitation is not visualized. Mild aortic valve sclerosis is present, with no evidence of aortic valve stenosis.  5. The inferior vena cava is dilated in size with <50% respiratory variability, suggesting right atrial pressure of 15 mmHg. FINDINGS  Left Ventricle: Left ventricular ejection fraction, by estimation, is 65 to 70%. The left ventricle has normal function. The left ventricle has no regional wall motion abnormalities. The left ventricular internal cavity size was normal in size. There is  moderately increased left ventricular hypertrophy of the basal-septal segment. The left ventricular diastology could not be evaluated due to atrial fibrillation. Left ventricular diastolic function could not be evaluated. Right Ventricle: The right ventricular size is normal. No increase in right ventricular wall thickness. Right ventricular systolic function is  normal. Tricuspid regurgitation signal is inadequate for  assessing PA pressure. Left Atrium: Left atrial size was normal in size. Right Atrium: Right atrial size was normal in size. Pericardium: There is no evidence of pericardial effusion. Mitral Valve: The mitral valve is normal in structure and function. Normal mobility of the mitral valve leaflets. Trivial mitral valve regurgitation. No evidence of mitral valve stenosis. Tricuspid Valve: The tricuspid valve is normal in structure. Tricuspid valve regurgitation is not demonstrated. No evidence of tricuspid stenosis. Aortic Valve: The aortic valve is tricuspid. Aortic valve regurgitation is not visualized. Mild aortic valve sclerosis is present, with no evidence of aortic valve stenosis. Pulmonic Valve: The pulmonic valve was normal in structure. Pulmonic valve regurgitation is trivial. No evidence of pulmonic stenosis. Aorta: The aortic root is normal in size and structure. Venous: The inferior vena cava is dilated in size with less than 50% respiratory variability, suggesting right atrial pressure of 15 mmHg. The inferior vena cava and the hepatic vein show a normal flow pattern. IAS/Shunts: No atrial level shunt detected by color flow Doppler.  LEFT VENTRICLE PLAX 2D LVIDd:         3.90 cm LVIDs:         2.70 cm LV PW:         1.10 cm LV IVS:        1.30 cm LVOT diam:     2.00 cm LV SV:         54.66 ml LV SV Index:   17.08 LVOT Area:     3.14 cm  RIGHT VENTRICLE TAPSE (M-mode): 1.6 cm LEFT ATRIUM             Index       RIGHT ATRIUM           Index LA diam:        4.10 cm 1.82 cm/m  RA Area:     11.80 cm LA Vol (A2C):   40.8 ml 18.12 ml/m RA Volume:   23.60 ml  10.48 ml/m LA Vol (A4C):   45.5 ml 20.20 ml/m LA Biplane Vol: 43.9 ml 19.49 ml/m  AORTIC VALVE LVOT Vmax:   96.70 cm/s LVOT Vmean:  70.800 cm/s LVOT VTI:    0.174 m  AORTA Ao Root diam: 3.30 cm  SHUNTS Systemic VTI:  0.17 m Systemic Diam: 2.00 cm Fransico Him MD Electronically signed by Fransico Him MD Signature Date/Time: 03/04/2019/10:36:09 AM     Final (Updated)    Patient Profile     79 y.o. male with a hx of CKD stage 3, HTN, DM, BPH who is being seen for new onset afib.  Assessment & Plan    Paroxysmal Afib - Patient found to be in Afib placed on dilt drip - Patient spontaneously converted to NSR. Remains in sinus - CHADSVASC 4. continue Eliquis 5 mg BID.  OK to hold for EGD/colosconopy tomorrow - transition to oral dilt - TSH 1.342, K 4.1, MG 1.4 >>supplement - EKG today - Echo showed EF 65-70%, no WMA, increased LV hypertrophy of the basal-septal segment>>plan for outpatient cardiac MRI - Patient was heme positive. Hgb 10.3>>plan for colonoscopy tomorrow per GI  DM2 - SSI per IM  AKI/CKD stage 3 - creatinine improving 1.90 > 1.74 -baseline around 1.6  HTN - Dilt started as above - Pressures slightly up - Patient is on Diovan at home>>might need to change given kidney insufficiency   For questions or updates, please contact [place] Please consult [email or web address]  for contact info under        Signed, Cadence Ninfa Meeker, PA-C  03/05/2019, 8:20 AM    Patient seen and examined.  Agree with above documentation.  On exam, patient is alert and oriented, regular rate and rhythm, no murmurs, lungs CTAB, no LE edema or JVD.  Telemetry personally reviewed, has remained in normal sinus rhythm, rates 60s to 70s.  For his atrial fibrillation, will continue p.o. diltiazem for rate control and Eliquis 5 mg twice daily for anticoagulation.  OK to hold Eliquis for EGD/colonoscopy planned for tomorrow, can restart following procedures.  Donato Heinz, MD

## 2019-03-05 NOTE — Progress Notes (Signed)
Subjective: No complaints.  Feeling well.  Objective: Vital signs in last 24 hours: Temp:  [97.5 F (36.4 C)-98.2 F (36.8 C)] 98 F (36.7 C) (02/10 2137) Pulse Rate:  [58-110] 61 (02/10 2137) Resp:  [14-18] 18 (02/10 2137) BP: (104-164)/(54-93) 138/61 (02/10 2137) SpO2:  [98 %-100 %] 99 % (02/10 2137) Last BM Date: 03/04/19  Intake/Output from previous day: 02/10 0701 - 02/11 0700 In: 1360 [P.O.:1360] Out: 450 [Urine:450] Intake/Output this shift: No intake/output data recorded.  General appearance: alert and no distress GI: soft, non-tender; bowel sounds normal; no masses,  no organomegaly  Lab Results: Recent Labs    03/03/19 2333 03/04/19 0550 03/05/19 0351  WBC 10.2 12.3* 10.2  HGB 10.4* 10.8* 10.3*  HCT 34.3* 34.9* 33.5*  PLT 219 236 237   BMET Recent Labs    03/03/19 2322 03/04/19 0550 03/05/19 0351  NA 137 136 138  K 4.3 3.6 4.1  CL 104 104 105  CO2 24 20* 24  GLUCOSE 98 215* 163*  BUN 21 18 15   CREATININE 2.09* 1.90* 1.74*  CALCIUM 13.0* 12.8* 12.7*   LFT Recent Labs    03/05/19 0351  PROT 6.9  ALBUMIN 3.7  AST 49*  ALT 47*  ALKPHOS 77  BILITOT 0.7   PT/INR No results for input(s): LABPROT, INR in the last 72 hours. Hepatitis Panel No results for input(s): HEPBSAG, HCVAB, HEPAIGM, HEPBIGM in the last 72 hours. C-Diff No results for input(s): CDIFFTOX in the last 72 hours. Fecal Lactopherrin No results for input(s): FECLLACTOFRN in the last 72 hours.  Studies/Results: DG Chest 2 View  Result Date: 03/03/2019 CLINICAL DATA:  Weakness. Dehydration. EXAM: CHEST - 2 VIEW COMPARISON:  Chest radiograph 02/27/2018 FINDINGS: The cardiomediastinal contours are normal. The lungs are clear. Pulmonary vasculature is normal. No consolidation, pleural effusion, or pneumothorax. No acute osseous abnormalities are seen. Degenerative change in the spine. IMPRESSION: No acute chest findings. Electronically Signed   By: Keith Rake M.D.   On:  03/03/2019 20:36   ECHOCARDIOGRAM COMPLETE  Result Date: 03/04/2019    ECHOCARDIOGRAM REPORT   Patient Name:   Martin Richards Date of Exam: 03/04/2019 Medical Rec #:  [id number]         Height:       74.5 in Accession #:    [id number]        Weight:       215.1 lb Date of Birth:  Aug 13, 1940        BSA:          2.25 m Patient Age:    79 years          BP:           128/79 mmHg Patient Gender: M                 HR:           96 bpm. Exam Location:  Inpatient Procedure: 2D Echo, Cardiac Doppler and Color Doppler Indications:     Dyspnea 786.09  History:         Patient has no prior history of Echocardiogram examinations.                  Risk Factors:Hypertension, Diabetes and Non-Smoker.  Sonographer:     Vickie Epley RDCS Referring Phys:  [id number] AMRIT ADHIKARI Diagnosing Phys: Fransico Him MD IMPRESSIONS  1. Left ventricular ejection fraction, by estimation, is 65 to 70%. The left ventricle has normal function. The left  ventrical has no regional wall motion abnormalities. There is moderately increased left ventricular hypertrophy of the basal-septal segment. Left ventricular diastolic function could not be evaluated secondary to atrial fibrillation.  2. Right ventricular systolic function is normal. The right ventricular size is normal. Tricuspid regurgitation signal is inadequate for assessing PA pressure.  3. The mitral valve is normal in structure and function. trivial mitral valve regurgitation. No evidence of mitral stenosis.  4. The aortic valve is tricuspid. Aortic valve regurgitation is not visualized. Mild aortic valve sclerosis is present, with no evidence of aortic valve stenosis.  5. The inferior vena cava is dilated in size with <50% respiratory variability, suggesting right atrial pressure of 15 mmHg. FINDINGS  Left Ventricle: Left ventricular ejection fraction, by estimation, is 65 to 70%. The left ventricle has normal function. The left ventricle has no regional wall motion abnormalities. The  left ventricular internal cavity size was normal in size. There is  moderately increased left ventricular hypertrophy of the basal-septal segment. The left ventricular diastology could not be evaluated due to atrial fibrillation. Left ventricular diastolic function could not be evaluated. Right Ventricle: The right ventricular size is normal. No increase in right ventricular wall thickness. Right ventricular systolic function is normal. Tricuspid regurgitation signal is inadequate for assessing PA pressure. Left Atrium: Left atrial size was normal in size. Right Atrium: Right atrial size was normal in size. Pericardium: There is no evidence of pericardial effusion. Mitral Valve: The mitral valve is normal in structure and function. Normal mobility of the mitral valve leaflets. Trivial mitral valve regurgitation. No evidence of mitral valve stenosis. Tricuspid Valve: The tricuspid valve is normal in structure. Tricuspid valve regurgitation is not demonstrated. No evidence of tricuspid stenosis. Aortic Valve: The aortic valve is tricuspid. Aortic valve regurgitation is not visualized. Mild aortic valve sclerosis is present, with no evidence of aortic valve stenosis. Pulmonic Valve: The pulmonic valve was normal in structure. Pulmonic valve regurgitation is trivial. No evidence of pulmonic stenosis. Aorta: The aortic root is normal in size and structure. Venous: The inferior vena cava is dilated in size with less than 50% respiratory variability, suggesting right atrial pressure of 15 mmHg. The inferior vena cava and the hepatic vein show a normal flow pattern. IAS/Shunts: No atrial level shunt detected by color flow Doppler.  LEFT VENTRICLE PLAX 2D LVIDd:         3.90 cm LVIDs:         2.70 cm LV PW:         1.10 cm LV IVS:        1.30 cm LVOT diam:     2.00 cm LV SV:         54.66 ml LV SV Index:   17.08 LVOT Area:     3.14 cm  RIGHT VENTRICLE TAPSE (M-mode): 1.6 cm LEFT ATRIUM             Index       RIGHT ATRIUM            Index LA diam:        4.10 cm 1.82 cm/m  RA Area:     11.80 cm LA Vol (A2C):   40.8 ml 18.12 ml/m RA Volume:   23.60 ml  10.48 ml/m LA Vol (A4C):   45.5 ml 20.20 ml/m LA Biplane Vol: 43.9 ml 19.49 ml/m  AORTIC VALVE LVOT Vmax:   96.70 cm/s LVOT Vmean:  70.800 cm/s LVOT VTI:    0.174 m  AORTA Ao Root diam: 3.30 cm  SHUNTS Systemic VTI:  0.17 m Systemic Diam: 2.00 cm Fransico Him MD Electronically signed by Fransico Him MD Signature Date/Time: 03/04/2019/10:36:09 AM    Final (Updated)     Medications:  Scheduled: . apixaban  5 mg Oral BID  . calcitonin  4 Units/kg Intramuscular BID  . diltiazem  120 mg Oral Daily  . feeding supplement (ENSURE ENLIVE)  237 mL Oral BID BM  . insulin aspart  0-9 Units Subcutaneous Q4H  . multivitamin with minerals  1 tablet Oral Daily  . phosphorus  500 mg Oral TID  . sodium chloride flush  3 mL Intravenous Once   Continuous:   Assessment/Plan: 1) IDA. 2) Heme positive stool. 3) Paroxysmal afib now in NSR.   The patient is in NSR and the plan is for him to be on anticoagulation.  He appears to be stable for GI work up.  Plan: 1) EGD/colonoscopy tomorrow.  LOS: 2 days   Domanique Luckett D 03/05/2019, 7:06 AM

## 2019-03-05 NOTE — Progress Notes (Signed)
Physical Therapy Treatment Patient Details Name: Martin Richards MRN: [id number] DOB: [birth year] Today's Date: 03/05/2019    History of Present Illness Pt is a 79 y.o. M with significant PMH of CKD, HTN, DM who presents from PCP with orthostatic hypotension and labs consistent with dehydration. Admitted with hypercalcemia, elevated CK, hypomagnesemia, and AKI; also developed new onset a. fib.    PT Comments    Patient received in bed, now agreeable to PT. Continues to mobilize well, able to ambulate in hallway with min guard. Introduced steps today, able to perform with Min guard and B railings- did have one time LOB due to turning too quickly to go back up stairs and needed ModA but otherwise steady. He was left sitting at EOB with family present, all needs otherwise met today.     Follow Up Recommendations  No PT follow up     Equipment Recommendations  None recommended by PT    Recommendations for Other Services       Precautions / Restrictions Precautions Precautions: Fall Restrictions Weight Bearing Restrictions: No    Mobility  Bed Mobility Overal bed mobility: Independent                Transfers Overall transfer level: Independent Equipment used: None                Ambulation/Gait Ambulation/Gait assistance: Counsellor (Feet): 200 Feet Assistive device: None Gait Pattern/deviations: Step-through pattern;Decreased stride length;Decreased dorsiflexion - right;Decreased dorsiflexion - left Gait velocity: decreased   General Gait Details: Pt with decreased bilateral heel strike at initial contact (likely due to tight gastrocs); no gross unsteadiness; slow speed   Stairs Stairs: Yes Stairs assistance: Min guard Stair Management: Two rails Number of Stairs: 8(x2) General stair comments: one time LOB requiring ModA to maintain upright when he turned too quick at the end of the steps, otherwise able to do steps with min guard and B  rails   Wheelchair Mobility    Modified Rankin (Stroke Patients Only)       Balance Overall balance assessment: Mild deficits observed, not formally tested                                          Cognition Arousal/Alertness: Awake/alert Behavior During Therapy: WFL for tasks assessed/performed Overall Cognitive Status: Within Functional Limits for tasks assessed                                        Exercises      General Comments        Pertinent Vitals/Pain Pain Assessment: No/denies pain    Home Living                      Prior Function            PT Goals (current goals can now be found in the care plan section) Acute Rehab PT Goals Patient Stated Goal: none stated; agreeable to therapy PT Goal Formulation: With patient Time For Goal Achievement: 03/18/19 Potential to Achieve Goals: Good Progress towards PT goals: Progressing toward goals    Frequency    Min 3X/week      PT Plan Current plan remains appropriate    Co-evaluation  AM-PAC PT "6 Clicks" Mobility   Outcome Measure  Help needed turning from your back to your side while in a flat bed without using bedrails?: None Help needed moving from lying on your back to sitting on the side of a flat bed without using bedrails?: None Help needed moving to and from a bed to a chair (including a wheelchair)?: None Help needed standing up from a chair using your arms (e.g., wheelchair or bedside chair)?: None Help needed to walk in hospital room?: A Little Help needed climbing 3-5 steps with a railing? : A Little 6 Click Score: 22    End of Session   Activity Tolerance: Patient tolerated treatment well Patient left: in bed;with call bell/phone within reach;with family/visitor present   PT Visit Diagnosis: History of falling (Z91.81);Difficulty in walking, not elsewhere classified (R26.2)     Time: 7858-8502 PT Time Calculation  (min) (ACUTE ONLY): 13 min  Charges:  $Gait Training: 8-22 mins                     Windell Norfolk, DPT, PN1   Supplemental Physical Therapist [place]    Pager [phone] Acute Rehab Office [phone]

## 2019-03-05 NOTE — Progress Notes (Signed)
PT Cancellation Note  Patient Details Name: MUAZ ALDRIDGE MRN: [id number] DOB: 10/21/1940   Cancelled Treatment:    Reason Eval/Treat Not Completed: Other (comment) patient standing at sink, requested help putting new gown on. Assisted in this task, then went to rest sitting at EOB as he had been standing for awhile to bathe and was fatigued. RN entered and gave patient medicine while he was resting, unfortunately he then began throwing up. He asks that PT return later. Will attempt to do so if time/schedule allow.    Windell Norfolk, DPT, PN1   Supplemental Physical Therapist [facility]    Pager [phone] Acute Rehab Office [phone]

## 2019-03-06 ENCOUNTER — Inpatient Hospital Stay (HOSPITAL_COMMUNITY): Payer: Medicare HMO

## 2019-03-06 ENCOUNTER — Inpatient Hospital Stay (HOSPITAL_COMMUNITY): Payer: Medicare HMO | Admitting: Registered Nurse

## 2019-03-06 ENCOUNTER — Encounter (HOSPITAL_COMMUNITY)
Admission: EM | Disposition: A | Discharge status: Home / Self Care | Payer: Self-pay | Source: Ambulatory Visit | Attending: Internal Medicine

## 2019-03-06 ENCOUNTER — Encounter (HOSPITAL_COMMUNITY): Payer: Self-pay | Admitting: Internal Medicine

## 2019-03-06 DIAGNOSIS — I480 Paroxysmal atrial fibrillation: Secondary | ICD-10-CM

## 2019-03-06 HISTORY — PX: POLYPECTOMY: SHX5525

## 2019-03-06 HISTORY — PX: COLONOSCOPY WITH PROPOFOL: SHX5780

## 2019-03-06 HISTORY — PX: BIOPSY: SHX5522

## 2019-03-06 HISTORY — PX: ESOPHAGOGASTRODUODENOSCOPY (EGD) WITH PROPOFOL: SHX5813

## 2019-03-06 HISTORY — PX: SUBMUCOSAL LIFTING INJECTION: SHX6855

## 2019-03-06 HISTORY — PX: HEMOSTASIS CLIP PLACEMENT: SHX6857

## 2019-03-06 LAB — BASIC METABOLIC PANEL
Anion gap: 11 (ref 5–15)
BUN: 14 mg/dL (ref 8–23)
CO2: 23 mmol/L (ref 22–32)
Calcium: 12.4 mg/dL — ABNORMAL HIGH (ref 8.9–10.3)
Chloride: 104 mmol/L (ref 98–111)
Creatinine, Ser: 1.69 mg/dL — ABNORMAL HIGH (ref 0.61–1.24)
GFR calc Af Amer: 45 mL/min — ABNORMAL LOW (ref >60)
GFR calc non Af Amer: 39 mL/min — ABNORMAL LOW (ref >60)
Glucose, Bld: 132 mg/dL — ABNORMAL HIGH (ref 70–99)
Potassium: 3.8 mmol/L (ref 3.5–5.1)
Sodium: 138 mmol/L (ref 135–145)

## 2019-03-06 LAB — GLUCOSE, CAPILLARY
Glucose-Capillary: 112 mg/dL — ABNORMAL HIGH (ref 70–99)
Glucose-Capillary: 121 mg/dL — ABNORMAL HIGH (ref 70–99)
Glucose-Capillary: 126 mg/dL — ABNORMAL HIGH (ref 70–99)
Glucose-Capillary: 130 mg/dL — ABNORMAL HIGH (ref 70–99)
Glucose-Capillary: 142 mg/dL — ABNORMAL HIGH (ref 70–99)
Glucose-Capillary: 96 mg/dL (ref 70–99)

## 2019-03-06 LAB — MAGNESIUM: Magnesium: 1.3 mg/dL — ABNORMAL LOW (ref 1.7–2.4)

## 2019-03-06 LAB — VITAMIN D 25 HYDROXY (VIT D DEFICIENCY, FRACTURES): Vit D, 25-Hydroxy: 26.75 ng/mL — ABNORMAL LOW (ref 30–100)

## 2019-03-06 SURGERY — ESOPHAGOGASTRODUODENOSCOPY (EGD) WITH PROPOFOL
Anesthesia: Monitor Anesthesia Care

## 2019-03-06 MED ORDER — LACTATED RINGERS IV SOLN: INTRAVENOUS | Status: DC

## 2019-03-06 MED ORDER — ZOLEDRONIC ACID 4 MG/5ML IV CONC
4.0000 mg | Freq: Once | INTRAVENOUS | Status: AC
  Administered 2019-03-06: 4 mg via INTRAVENOUS
  Filled 2019-03-06: qty 5

## 2019-03-06 MED ORDER — CINACALCET HCL 30 MG PO TABS
30.0000 mg | ORAL_TABLET | Freq: Every day | ORAL | Status: DC
  Administered 2019-03-07 – 2019-03-08 (×2): 30 mg via ORAL
  Filled 2019-03-06 (×4): qty 1

## 2019-03-06 MED ORDER — PROPOFOL 500 MG/50ML IV EMUL
INTRAVENOUS | Status: DC | PRN
  Administered 2019-03-06: 75 ug/kg/min via INTRAVENOUS

## 2019-03-06 MED ORDER — MAGNESIUM SULFATE 4 GM/100ML IV SOLN
4.0000 g | Freq: Once | INTRAVENOUS | Status: AC
  Administered 2019-03-06: 4 g via INTRAVENOUS
  Filled 2019-03-06: qty 100

## 2019-03-06 MED ORDER — TECHNETIUM TC 99M SESTAMIBI GENERIC - CARDIOLITE
24.0000 | Freq: Once | INTRAVENOUS | Status: AC | PRN
  Administered 2019-03-06: 24 via INTRAVENOUS

## 2019-03-06 MED ORDER — APIXABAN 5 MG PO TABS
5.0000 mg | ORAL_TABLET | Freq: Two times a day (BID) | ORAL | Status: DC
  Administered 2019-03-06 – 2019-03-08 (×5): 5 mg via ORAL
  Filled 2019-03-06 (×5): qty 1

## 2019-03-06 MED ORDER — PROPOFOL 10 MG/ML IV BOLUS
INTRAVENOUS | Status: DC | PRN
  Administered 2019-03-06: 20 mg via INTRAVENOUS

## 2019-03-06 MED ORDER — LIDOCAINE 2% (20 MG/ML) 5 ML SYRINGE
INTRAMUSCULAR | Status: DC | PRN
  Administered 2019-03-06: 100 mg via INTRAVENOUS

## 2019-03-06 SURGICAL SUPPLY — 25 items

## 2019-03-06 NOTE — Anesthesia Preprocedure Evaluation (Signed)
Anesthesia Evaluation  Patient identified by MRN, date of birth, ID band Patient awake    Reviewed: Allergy & Precautions, NPO status , Patient's Chart, lab work & pertinent test results  Airway Mallampati: II  TM Distance: >3 FB Neck ROM: Full    Dental no notable dental hx.    Pulmonary neg pulmonary ROS,    Pulmonary exam normal breath sounds clear to auscultation       Cardiovascular hypertension, Pt. on medications negative cardio ROS Normal cardiovascular exam Rhythm:Regular Rate:Normal     Neuro/Psych negative neurological ROS  negative psych ROS   GI/Hepatic negative GI ROS, Neg liver ROS,   Endo/Other  diabetesMorbid obesity  Renal/GU negative Renal ROS  negative genitourinary   Musculoskeletal negative musculoskeletal ROS (+)   Abdominal (+) + obese,   Peds negative pediatric ROS (+)  Hematology negative hematology ROS (+)   Anesthesia Other Findings   Reproductive/Obstetrics negative OB ROS                             Anesthesia Physical Anesthesia Plan  ASA: III  Anesthesia Plan: MAC   Post-op Pain Management:    Induction: Intravenous  PONV Risk Score and Plan: 1 and Treatment may vary due to age or medical condition  Airway Management Planned: Nasal Cannula  Additional Equipment:   Intra-op Plan:   Post-operative Plan:   Informed Consent: I have reviewed the patients History and Physical, chart, labs and discussed the procedure including the risks, benefits and alternatives for the proposed anesthesia with the patient or authorized representative who has indicated his/her understanding and acceptance.     Dental advisory given  Plan Discussed with: CRNA  Anesthesia Plan Comments:         Anesthesia Quick Evaluation

## 2019-03-06 NOTE — Progress Notes (Signed)
PROGRESS NOTE    Martin Richards  [id number] DOB: Jun 17, 1940 DOA: 03/03/2019 PCP: Patient, No Pcp Per   Brief Narrative: Patient is a 79 year old male with history of hypertension, CKD stage IIIb, hypertension, anemia who was sent by his PCP the emergency department after he was found to be orthostatic, dehydrated.  It was reported that patient was not drinking or eating well since last few days.  He also lost 8 pounds of weight over the last week.  Also complained of some abdominal pain.  On presentation he was found to have hypercalcemia, AKI with creatinine of 2.5, hypophosphatemia, elevated CK.  During this hospitalization, he went into A. fib with RVR which is a new finding.  Cardiology consulted. Along with hypercalcemia, he was found to have elevated PTH.  He underwent  nuclear parathyroid scan which showed left lower parathyroid gland adenoma.  Undergoing EGD/colonoscopy today.  Assessment & Plan:   Active Problems:   Dehydration   Acute on chronic renal failure (HCC)   Hypercalcemia   Elevated LFTs   Rhabdomyolysis   Hypomagnesemia   Low phosphate levels   Essential hypertension   DM (diabetes mellitus), type 2 with complications (HCC)   CKD (chronic kidney disease), stage III   Paroxysmal A. fib with RVR: New onset.CHA2DS2VASc score of 4.  Echocardiogram done here showed normal left ventricular ejection fraction, mild left ventricular hypertrophy, no obvious valvular abnormalities.  Cardiology following.  He converted to normal sinus rhythm.  On Cardizem for rate control.  Plan is to start on Eliquis after EGD/colonoscopy.  Hypercalcemia: Report of not eating or drinking well at home with severe loss of appetite.  Calcium level slightly improved with IV fluids,calcitonin.  TSH is normal.  PTH is elevated.Low PO4 No history of smoking, chronic alcohol abuse.  No history of cancer in the family or himself.  Normal PSA.he follows with urology for BPH. Picture was highly  suggestive of parathyroid gland abnormality and  nuclear parathyroid scan was positive for left inferior parathyroid gland adenoma.   Started on cinacalcet.  We will give a dose of zoledronic acid today.  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 Scherrie Merritts.  He would also follow-up with Dr. Buddy Duty, endocrinology as an outpatient.  He has an appointment on February 24, 2 PM  Hypomagnesemia/hypophosphatemia:  Supplemented  Hypertension: Currently blood pressure stable.He was on diovan at home.  AKI on CKD stage IIIb: Baseline creatinine around 1.6.  Presented with creatinine of 2.5.  Kidney function improved with IV fluids.He was taking diovan at home.He should follow up with nephrology as an outpatient.  Diabetes type 2: Continue sliding scale insulin for now.  Elevated LFTs/CK : report of falling at home.  Continue IV fluids.   Microcytic anemia: Iron panel shows normal iron, low ferritin.  Currently H&H stable.  GI planning for EGD/colonoscopy today.  Deconditioning/fall: requested for physical therapy evaluation   Nutrition Problem: Inadequate oral intake Etiology: poor appetite      DVT prophylaxis:Lovenox Code Status: Full Family Communication: Discussed with wife on phone on 03/05/19 Disposition Plan: Patient is from home.  He is not stable for discharge yet because of acute kidney injury, hypercalcemia Expected discharge will be to home.  Waiting for physical therapy evaluation.  Undergoing  EGD/colonoscopy today.   Consultants: cardiology  Procedures:None  Antimicrobials:  Anti-infectives (From admission, onward)   None      Subjective:  Patient seen and examined the bedside  this afteroon.  Hemodynamically stable.  Feels better.  No new complaints.Came back from colonoscopy/EGD  Objective: Vitals:   03/05/19 0831 03/05/19 1300 03/05/19 2044 03/06/19 0500  BP: (!) 148/60 (!) 148/68 (!) 158/80 (!) 152/78  Pulse:   77 79 67  Resp:  20 16 16   Temp:  97.8 F (36.6 C) 98.3 F (36.8 C) 98.1 F (36.7 C)  TempSrc:  Oral Oral Oral  SpO2:  99% 100% 95%  Weight:    96.3 kg  Height:        Intake/Output Summary (Last 24 hours) at 03/06/2019 0817 Last data filed at 03/06/2019 0500 Gross per 24 hour  Intake 697.29 ml  Output 453 ml  Net 244.29 ml   Filed Weights   03/04/19 0111 03/06/19 0500  Weight: 97.6 kg 96.3 kg    Examination:   General exam: Not in distress, comfortable Respiratory system: Bilateral equal air entry, normal vesicular breath sounds, no wheezes or crackles  Cardiovascular system: S1 & S2 heard, RRR. No JVD, murmurs, rubs, gallops or clicks. Gastrointestinal system: Abdomen is nondistended, soft and nontender. No organomegaly or masses felt. Normal bowel sounds heard. Central nervous system: Alert and oriented. No focal neurological deficits. Extremities: No edema, no clubbing ,no cyanosis Skin: No rashes, lesions or ulcers,no icterus ,no pallor       Data Reviewed: I have personally reviewed following labs and imaging studies  CBC: Recent Labs  Lab 03/03/19 1644 03/03/19 2333 03/04/19 0550 03/05/19 0351  WBC 8.1 10.2 12.3* 10.2  NEUTROABS  --  6.2  --  7.6  HGB 10.4* 10.4* 10.8* 10.3*  HCT 34.4* 34.3* 34.9* 33.5*  MCV 75.4* 75.6* 73.9* 73.3*  PLT 225 219 236 [id number]   Basic Metabolic Panel: Recent Labs  Lab 03/03/19 1644 03/03/19 2025 03/03/19 2322 03/04/19 0550 03/05/19 0351 03/06/19 0404  NA 135  --  137 136 138 138  K 4.1  --  4.3 3.6 4.1 3.8  CL 101  --  104 104 105 104  CO2 26  --  24 20* 24 23  GLUCOSE 127*  --  98 215* 163* 132*  BUN 22  --  21 18 15 14   CREATININE 2.23*  --  2.09* 1.90* 1.74* 1.69*  CALCIUM 13.6*  --  13.0* 12.8* 12.7* 12.4*  MG  --  1.2*  --  1.2* 1.4* 1.3*  PHOS  --  2.0*  --  2.1* 2.6  --    GFR: Estimated Creatinine Clearance: 43.9 mL/min (A) (by C-G formula based on SCr of 1.69 mg/dL (H)). Liver Function Tests: Recent  Labs  Lab 03/03/19 1644 03/04/19 0550 03/05/19 0351  AST 58* 59* 49*  ALT 55* 55* 47*  ALKPHOS 77 78 77  BILITOT 0.4 0.7 0.7  PROT 7.2 7.3 6.9  ALBUMIN 4.1 3.9 3.7   Recent Labs  Lab 03/03/19 1644  LIPASE 24   No results for input(s): AMMONIA in the last 168 hours. Coagulation Profile: No results for input(s): INR, PROTIME in the last 168 hours. Cardiac Enzymes: Recent Labs  Lab 03/03/19 2025 03/04/19 0550 03/05/19 0351  CKTOTAL 719* 726* 719*   BNP (last 3 results) No results for input(s): PROBNP in the last 8760 hours. HbA1C: Recent Labs    03/04/19 0550  HGBA1C 7.0*   CBG: Recent Labs  Lab 03/05/19 1549 03/05/19 2041 03/06/19 0028 03/06/19 0410 03/06/19 0748  GLUCAP 147* 152* 112* 121* 126*   Lipid Profile: No results for input(s): CHOL, HDL, LDLCALC,  TRIG, CHOLHDL, LDLDIRECT in the last 72 hours. Thyroid Function Tests: Recent Labs    03/03/19 2333  TSH 1.342   Anemia Panel: Recent Labs    03/03/19 2025 03/03/19 2322 03/03/19 2333  VITAMINB12  --   --  652  FOLATE  --  7.6  --   FERRITIN  --   --  13*  TIBC  --   --  444  IRON  --   --  76  RETICCTPCT 1.4  --   --    Sepsis Labs: No results for input(s): PROCALCITON, LATICACIDVEN in the last 168 hours.  Recent Results (from the past 240 hour(s))  SARS CORONAVIRUS 2 (TAT 6-24 HRS) Nasopharyngeal Nasopharyngeal Swab     Status: None   Collection Time: 03/03/19  7:29 PM   Specimen: Nasopharyngeal Swab  Result Value Ref Range Status   SARS Coronavirus 2 NEGATIVE NEGATIVE Final    Comment: (NOTE) SARS-CoV-2 target nucleic acids are NOT DETECTED. The SARS-CoV-2 RNA is generally detectable in upper and lower respiratory specimens during the acute phase of infection. Negative results do not preclude SARS-CoV-2 infection, do not rule out co-infections with other pathogens, and should not be used as the sole basis for treatment or other patient management decisions. Negative results must be  combined with clinical observations, patient history, and epidemiological information. The expected result is Negative. Fact Sheet for Patients: SugarRoll.be Fact Sheet for Healthcare Providers: https://www.woods-mathews.com/ This test is not yet approved or cleared by the [place] FDA and  has been authorized for detection and/or diagnosis of SARS-CoV-2 by FDA under an Emergency Use Authorization (EUA). This EUA will remain  in effect (meaning this test can be used) for the duration of the COVID-19 declaration under Section 56 4(b)(1) of the Act, 21 U.S.C. section 360bbb-3(b)(1), unless the authorization is terminated or revoked sooner. Performed at [facility], [place] [address], [place], [place] [address]          Radiology Studies: ECHOCARDIOGRAM COMPLETE  Result Date: 03/04/2019    ECHOCARDIOGRAM REPORT   Patient Name:   JAQUE BAHN Date of Exam: 03/04/2019 Medical Rec #:  [id number]         Height:       74.5 in Accession #:    [id number]        Weight:       215.1 lb Date of Birth:  07-23-1940        BSA:          2.25 m Patient Age:    66 years          BP:           128/79 mmHg Patient Gender: M                 HR:           96 bpm. Exam Location:  Inpatient Procedure: 2D Echo, Cardiac Doppler and Color Doppler Indications:     Dyspnea 786.09  History:         Patient has no prior history of Echocardiogram examinations.                  Risk Factors:Hypertension, Diabetes and Non-Smoker.  Sonographer:     Vickie Epley RDCS Referring Phys:  [id number] Hershell Brandl Diagnosing Phys: Fransico Him MD IMPRESSIONS  1. Left ventricular ejection fraction, by estimation, is 65 to 70%. The left ventricle has normal function. The left ventrical has no regional wall  motion abnormalities. There is moderately increased left ventricular hypertrophy of the basal-septal segment. Left ventricular diastolic function could not be evaluated secondary  to atrial fibrillation.  2. Right ventricular systolic function is normal. The right ventricular size is normal. Tricuspid regurgitation signal is inadequate for assessing PA pressure.  3. The mitral valve is normal in structure and function. trivial mitral valve regurgitation. No evidence of mitral stenosis.  4. The aortic valve is tricuspid. Aortic valve regurgitation is not visualized. Mild aortic valve sclerosis is present, with no evidence of aortic valve stenosis.  5. The inferior vena cava is dilated in size with <50% respiratory variability, suggesting right atrial pressure of 15 mmHg. FINDINGS  Left Ventricle: Left ventricular ejection fraction, by estimation, is 65 to 70%. The left ventricle has normal function. The left ventricle has no regional wall motion abnormalities. The left ventricular internal cavity size was normal in size. There is  moderately increased left ventricular hypertrophy of the basal-septal segment. The left ventricular diastology could not be evaluated due to atrial fibrillation. Left ventricular diastolic function could not be evaluated. Right Ventricle: The right ventricular size is normal. No increase in right ventricular wall thickness. Right ventricular systolic function is normal. Tricuspid regurgitation signal is inadequate for assessing PA pressure. Left Atrium: Left atrial size was normal in size. Right Atrium: Right atrial size was normal in size. Pericardium: There is no evidence of pericardial effusion. Mitral Valve: The mitral valve is normal in structure and function. Normal mobility of the mitral valve leaflets. Trivial mitral valve regurgitation. No evidence of mitral valve stenosis. Tricuspid Valve: The tricuspid valve is normal in structure. Tricuspid valve regurgitation is not demonstrated. No evidence of tricuspid stenosis. Aortic Valve: The aortic valve is tricuspid. Aortic valve regurgitation is not visualized. Mild aortic valve sclerosis is present, with no  evidence of aortic valve stenosis. Pulmonic Valve: The pulmonic valve was normal in structure. Pulmonic valve regurgitation is trivial. No evidence of pulmonic stenosis. Aorta: The aortic root is normal in size and structure. Venous: The inferior vena cava is dilated in size with less than 50% respiratory variability, suggesting right atrial pressure of 15 mmHg. The inferior vena cava and the hepatic vein show a normal flow pattern. IAS/Shunts: No atrial level shunt detected by color flow Doppler.  LEFT VENTRICLE PLAX 2D LVIDd:         3.90 cm LVIDs:         2.70 cm LV PW:         1.10 cm LV IVS:        1.30 cm LVOT diam:     2.00 cm LV SV:         54.66 ml LV SV Index:   17.08 LVOT Area:     3.14 cm  RIGHT VENTRICLE TAPSE (M-mode): 1.6 cm LEFT ATRIUM             Index       RIGHT ATRIUM           Index LA diam:        4.10 cm 1.82 cm/m  RA Area:     11.80 cm LA Vol (A2C):   40.8 ml 18.12 ml/m RA Volume:   23.60 ml  10.48 ml/m LA Vol (A4C):   45.5 ml 20.20 ml/m LA Biplane Vol: 43.9 ml 19.49 ml/m  AORTIC VALVE LVOT Vmax:   96.70 cm/s LVOT Vmean:  70.800 cm/s LVOT VTI:    0.174 m  AORTA Ao Root diam: 3.30  cm  SHUNTS Systemic VTI:  0.17 m Systemic Diam: 2.00 cm Fransico Him MD Electronically signed by Fransico Him MD Signature Date/Time: 03/04/2019/10:36:09 AM    Final (Updated)         Scheduled Meds: . diltiazem  120 mg Oral Daily  . feeding supplement (ENSURE ENLIVE)  237 mL Oral BID BM  . insulin aspart  0-9 Units Subcutaneous Q4H  . multivitamin with minerals  1 tablet Oral Daily  . sodium chloride flush  3 mL Intravenous Once   Continuous Infusions: . sodium chloride    . sodium chloride 100 mL/hr at 03/06/19 0627     LOS: 3 days    Time spent: 35 mins,More than 50% of that time was spent in counseling and/or coordination of care.      Shelly Coss, MD [facility] P2/12/2019, 8:17 AM

## 2019-03-06 NOTE — Care Management Important Message (Signed)
Important Message  Patient Details  Name: Martin Richards MRN: [id number] Date of Birth: 11-02-1940   Medicare Important Message Given:  Yes     Shelda Altes 03/06/2019, 11:27 AM

## 2019-03-06 NOTE — Plan of Care (Signed)

## 2019-03-06 NOTE — Progress Notes (Signed)
   03/06/19 1845  What Happened  Was fall witnessed? No  Was patient injured? No  Patient found on floor  Found by Staff-comment Vernie Shanks RN)  Stated prior activity other (comment) (in chair)  Follow Up  MD notified Adhikari MD  Time MD notified [phone]  Adult Fall Risk Assessment  Risk Factor Category (scoring not indicated) Fall has occurred during this admission (document High fall risk)  Age 79  Fall History: Fall within 6 months prior to admission 5  Elimination; Bowel and/or Urine Incontinence 0  Elimination; Bowel and/or Urine Urgency/Frequency 0  Medications: includes PCA/Opiates, Anti-convulsants, Anti-hypertensives, Diuretics, Hypnotics, Laxatives, Sedatives, and Psychotropics 3  Patient Care Equipment 2  Mobility-Assistance 0  Mobility-Gait 0  Mobility-Sensory Deficit 0  Altered awareness of immediate physical environment 0  Impulsiveness 0  Lack of understanding of one's physical/cognitive limitations 0  Total Score 12  Patient Fall Risk Level Moderate fall risk  Adult Fall Risk Interventions  Required Bundle Interventions *See Row Information* High fall risk - low, moderate, and high requirements implemented  Screening for Fall Injury Risk (To be completed on HIGH fall risk patients) - Assessing Need for Low Bed  Risk For Fall Injury- Low Bed Criteria Previous fall this admission (today)  Will Implement Low Bed and Floor Mats Yes (ordered)  Vitals  BP (!) 164/83  MAP (mmHg) 102  ECG Heart Rate 80  Oxygen Therapy  SpO2 98 %  O2 Device Room Air  Patient Activity (if Appropriate) In bed  Pulse Oximetry Type Continuous  Pain Assessment  Pain Scale 0-10  Pain Score 0  Neurological  Neuro (WDL) WDL  Level of Consciousness Alert  Orientation Level Oriented X4;Oriented to person;Oriented to place;Oriented to time;Oriented to situation  Cognition Appropriate at baseline;Appropriate attention/concentration;Appropriate safety awareness;Follows commands  Speech Clear   Glasgow Coma Scale  Eye Opening 4  Best Verbal Response (NON-intubated) 5  Best Motor Response 6  Glasgow Coma Scale Score 15  Musculoskeletal  Musculoskeletal (WDL) WDL  Weight Bearing Restrictions No  Integumentary  Integumentary (WDL) WDL   Pt stated he attempted to get up off of the chair he was sitting on when the chair slid out from under him. Pt states the wheels were not locked causing him to fall onto his bottom. This fall was not seen by staff however pt rang his call bell for help, and when staff entered the room he had been on the floor. Pt states he did not hit his head. On assessment there are no new visual findings, pt continues to be A&Ox4, and reports no pain. VSS. Fall risk interventions implemented- floor mats placed and low bed ordered for pt. Post fall flow sheet completed as well as MD notified- awaiting response. Will update family if requested by pt.

## 2019-03-06 NOTE — Interval H&P Note (Signed)
History and Physical Interval Note:  03/06/2019 12:08 PM  Martin Richards  has presented today for surgery, with the diagnosis of EGD/colonoscopy.  The various methods of treatment have been discussed with the patient and family. After consideration of risks, benefits and other options for treatment, the patient has consented to  Procedure(s): ESOPHAGOGASTRODUODENOSCOPY (EGD) WITH PROPOFOL (N/A) COLONOSCOPY WITH PROPOFOL (N/A) as a surgical intervention.  The patient's history has been reviewed, patient examined, no change in status, stable for surgery.  I have reviewed the patient's chart and labs.  Questions were answered to the patient's satisfaction.     Blakeley Margraf D

## 2019-03-06 NOTE — Anesthesia Postprocedure Evaluation (Signed)
Anesthesia Post Note  Patient: Martin Richards  Procedure(s) Performed: ESOPHAGOGASTRODUODENOSCOPY (EGD) WITH PROPOFOL (N/A ) COLONOSCOPY WITH PROPOFOL (N/A ) BIOPSY POLYPECTOMY SUBMUCOSAL LIFTING INJECTION HEMOSTASIS CLIP PLACEMENT FOREIGN BODY REMOVAL     Patient location during evaluation: Endoscopy Anesthesia Type: MAC Level of consciousness: awake and alert Pain management: pain level controlled Vital Signs Assessment: post-procedure vital signs reviewed and stable Respiratory status: spontaneous breathing, nonlabored ventilation and respiratory function stable Cardiovascular status: blood pressure returned to baseline and stable Postop Assessment: no apparent nausea or vomiting Anesthetic complications: no    Last Vitals:  Vitals:   03/06/19 1405 03/06/19 1415  BP: (!) 141/95 (!) 144/77  Pulse: (!) 53 68  Resp: 13 15  Temp:    SpO2: 99% 100%    Last Pain:  Vitals:   03/06/19 1415  TempSrc:   PainSc: 0-No pain                 Lynda Rainwater

## 2019-03-06 NOTE — Transfer of Care (Signed)
Immediate Anesthesia Transfer of Care Note  Patient: Martin Richards  Procedure(s) Performed: ESOPHAGOGASTRODUODENOSCOPY (EGD) WITH PROPOFOL (N/A ) COLONOSCOPY WITH PROPOFOL (N/A ) BIOPSY POLYPECTOMY SUBMUCOSAL LIFTING INJECTION HEMOSTASIS CLIP PLACEMENT FOREIGN BODY REMOVAL  Patient Location: PACU and Endoscopy Unit  Anesthesia Type:MAC  Level of Consciousness: awake, alert  and oriented  Airway & Oxygen Therapy: Patient Spontanous Breathing and Patient connected to nasal cannula oxygen  Post-op Assessment: Report given to RN and Post -op Vital signs reviewed and stable  Post vital signs: Reviewed and stable  Last Vitals:  Vitals Value Taken Time  BP    Temp    Pulse    Resp    SpO2      Last Pain:  Vitals:   03/06/19 1130  TempSrc: Oral  PainSc: 0-No pain      Patients Stated Pain Goal: 0 (16/10/96 0454)  Complications: No apparent anesthesia complications

## 2019-03-06 NOTE — Op Note (Signed)
[facility] Patient Name: Martin Richards Procedure Date : 03/06/2019 MRN: [id number] Attending MD: Carol Ada , MD Date of Birth: 04-25-1940 CSN: [id number] Age: 79 Admit Type: Inpatient Procedure:                Small bowel enteroscopy Indications:              Iron deficiency anemia Providers:                Carol Ada, MD, Kary Kos, RN, Jeanella Cara, RN, Elspeth Cho Tech., Technician,                            Dewitt Hoes, CRNA Referring MD:              Medicines:                Propofol per Anesthesia Complications:            No immediate complications. Estimated Blood Loss:     Estimated blood loss was minimal. Procedure:                Pre-Anesthesia Assessment:                           - Prior to the procedure, a History and Physical                            was performed, and patient medications and                            allergies were reviewed. The patient's tolerance of                            previous anesthesia was also reviewed. The risks                            and benefits of the procedure and the sedation                            options and risks were discussed with the patient.                            All questions were answered, and informed consent                            was obtained. Prior Anticoagulants: The patient has                            taken no previous anticoagulant or antiplatelet                            agents. ASA Grade Assessment: III - A patient with  severe systemic disease. After reviewing the risks                            and benefits, the patient was deemed in                            satisfactory condition to undergo the procedure.                           - Sedation was administered by an anesthesia                            professional. Deep sedation was attained.                           After obtaining informed  consent, the endoscope was                            passed under direct vision. Throughout the                            procedure, the patient's blood pressure, pulse, and                            oxygen saturations were monitored continuously. The                            [id number]) Olympus pediatric colonoscope                            was introduced through the mouth, and advanced to                            the small bowel distal to the Ligament of Treitz.                            The small bowel enteroscopy was accomplished                            without difficulty. The patient tolerated the                            procedure well. Scope In: Scope Out: Findings:      The esophagus was normal.      Diffuse moderate inflammation characterized by erythema was found in the       gastric body. Biopsies were taken with a cold forceps for histology.      The examined duodenum was normal.      There was no evidence of significant pathology in the entire examined       portion of jejunum. Impression:               - Normal esophagus.                           - Gastritis. Biopsied.                           -  Normal examined duodenum.                           - The examined portion of the jejunum was normal. Recommendation:           - Await biopsy results.                           - Proceed with the colonoscopy. Procedure Code(s):        --- Professional ---                           [phone], Small intestinal endoscopy, enteroscopy                            beyond second portion of duodenum, not including                            ileum; with biopsy, single or multiple Diagnosis Code(s):        --- Professional ---                           K29.70, Gastritis, unspecified, without bleeding                           D50.9, Iron deficiency anemia, unspecified CPT copyright 2019 [employer]. All rights reserved. The codes documented in this  report are preliminary and upon coder review may  be revised to meet current compliance requirements. Carol Ada, MD Carol Ada, MD 03/06/2019 1:49:02 PM This report has been signed electronically. Number of Addenda: 0

## 2019-03-06 NOTE — Progress Notes (Addendum)
Progress Note  Patient Name: Martin Richards Date of Encounter: 03/06/2019  Primary Cardiologist: Donato Heinz, MD   Subjective   EGD/colonoscopy today. Patient remains in NSR. Tele does show a brief episode of ?atrial tachycardia overnight.  Inpatient Medications    Scheduled Meds: . diltiazem  120 mg Oral Daily  . feeding supplement (ENSURE ENLIVE)  237 mL Oral BID BM  . insulin aspart  0-9 Units Subcutaneous Q4H  . multivitamin with minerals  1 tablet Oral Daily  . sodium chloride flush  3 mL Intravenous Once   Continuous Infusions: . sodium chloride    . sodium chloride 100 mL/hr at 03/06/19 0627   PRN Meds: acetaminophen **OR** acetaminophen, HYDROcodone-acetaminophen, loperamide, ondansetron **OR** ondansetron (ZOFRAN) IV   Vital Signs    Vitals:   03/05/19 0831 03/05/19 1300 03/05/19 2044 03/06/19 0500  BP: (!) 148/60 (!) 148/68 (!) 158/80 (!) 152/78  Pulse:  77 79 67  Resp:  20 16 16   Temp:  97.8 F (36.6 C) 98.3 F (36.8 C) 98.1 F (36.7 C)  TempSrc:  Oral Oral Oral  SpO2:  99% 100% 95%  Weight:    96.3 kg  Height:        Intake/Output Summary (Last 24 hours) at 03/06/2019 0801 Last data filed at 03/06/2019 0500 Gross per 24 hour  Intake 697.29 ml  Output 453 ml  Net 244.29 ml   Last 3 Weights 03/06/2019 03/04/2019 02/27/2018  Weight (lbs) 212 lb 4.8 oz 215 lb 1.6 oz 220 lb  Weight (kg) 96.299 kg 97.569 kg 99.791 kg      Telemetry    NSR, HR 80-90s, brief episode of atrial tachycardia ?afib - Personally Reviewed  ECG    No new - Personally Reviewed  Physical Exam   GEN: No acute distress.   Neck: No JVD Cardiac: RRR, no murmurs, rubs, or gallops.  Respiratory: Clear to auscultation bilaterally. GI: Soft, nontender, non-distended  MS: No edema; No deformity. Neuro:  Nonfocal  Psych: Normal affect   Labs    High Sensitivity Troponin:  No results for input(s): TROPONINIHS in the last 720 hours.    Chemistry Recent Labs    Lab 03/03/19 1644 03/03/19 2322 03/04/19 0550 03/05/19 0351 03/06/19 0404  NA 135   < > 136 138 138  K 4.1   < > 3.6 4.1 3.8  CL 101   < > 104 105 104  CO2 26   < > 20* 24 23  GLUCOSE 127*   < > 215* 163* 132*  BUN 22   < > 18 15 14   CREATININE 2.23*   < > 1.90* 1.74* 1.69*  CALCIUM 13.6*   < > 12.8* 12.7* 12.4*  PROT 7.2  --  7.3 6.9  --   ALBUMIN 4.1  --  3.9 3.7  --   AST 58*  --  59* 49*  --   ALT 55*  --  55* 47*  --   ALKPHOS 77  --  78 77  --   BILITOT 0.4  --  0.7 0.7  --   GFRNONAA 28*   < > 33* 37* 39*  GFRAA 32*   < > 39* 43* 45*  ANIONGAP 8   < > 12 9 11    < > = values in this interval not displayed.     Hematology Recent Labs  Lab 03/03/19 2333 03/04/19 0550 03/05/19 0351  WBC 10.2 12.3* 10.2  RBC 4.54 4.72 4.57  HGB 10.4* 10.8* 10.3*  HCT 34.3* 34.9* 33.5*  MCV 75.6* 73.9* 73.3*  MCH 22.9* 22.9* 22.5*  MCHC 30.3 30.9 30.7  RDW 16.9* 16.6* 16.6*  PLT 219 236 237    BNPNo results for input(s): BNP, PROBNP in the last 168 hours.   DDimer No results for input(s): DDIMER in the last 168 hours.   Radiology    ECHOCARDIOGRAM COMPLETE  Result Date: 03/04/2019    ECHOCARDIOGRAM REPORT   Patient Name:   Martin Richards Date of Exam: 03/04/2019 Medical Rec #:  [id number]         Height:       74.5 in Accession #:    [id number]        Weight:       215.1 lb Date of Birth:  12/11/1940        BSA:          2.25 m Patient Age:    79 years          BP:           128/79 mmHg Patient Gender: M                 HR:           96 bpm. Exam Location:  Inpatient Procedure: 2D Echo, Cardiac Doppler and Color Doppler Indications:     Dyspnea 786.09  History:         Patient has no prior history of Echocardiogram examinations.                  Risk Factors:Hypertension, Diabetes and Non-Smoker.  Sonographer:     Vickie Epley RDCS Referring Phys:  [id number] AMRIT ADHIKARI Diagnosing Phys: Fransico Him MD IMPRESSIONS  1. Left ventricular ejection fraction, by estimation, is 65 to  70%. The left ventricle has normal function. The left ventrical has no regional wall motion abnormalities. There is moderately increased left ventricular hypertrophy of the basal-septal segment. Left ventricular diastolic function could not be evaluated secondary to atrial fibrillation.  2. Right ventricular systolic function is normal. The right ventricular size is normal. Tricuspid regurgitation signal is inadequate for assessing PA pressure.  3. The mitral valve is normal in structure and function. trivial mitral valve regurgitation. No evidence of mitral stenosis.  4. The aortic valve is tricuspid. Aortic valve regurgitation is not visualized. Mild aortic valve sclerosis is present, with no evidence of aortic valve stenosis.  5. The inferior vena cava is dilated in size with <50% respiratory variability, suggesting right atrial pressure of 15 mmHg. FINDINGS  Left Ventricle: Left ventricular ejection fraction, by estimation, is 65 to 70%. The left ventricle has normal function. The left ventricle has no regional wall motion abnormalities. The left ventricular internal cavity size was normal in size. There is  moderately increased left ventricular hypertrophy of the basal-septal segment. The left ventricular diastology could not be evaluated due to atrial fibrillation. Left ventricular diastolic function could not be evaluated. Right Ventricle: The right ventricular size is normal. No increase in right ventricular wall thickness. Right ventricular systolic function is normal. Tricuspid regurgitation signal is inadequate for assessing PA pressure. Left Atrium: Left atrial size was normal in size. Right Atrium: Right atrial size was normal in size. Pericardium: There is no evidence of pericardial effusion. Mitral Valve: The mitral valve is normal in structure and function. Normal mobility of the mitral valve leaflets. Trivial mitral valve regurgitation. No evidence of mitral valve stenosis. Tricuspid Valve: The  tricuspid valve is normal in structure. Tricuspid valve regurgitation is not demonstrated. No evidence of tricuspid stenosis. Aortic Valve: The aortic valve is tricuspid. Aortic valve regurgitation is not visualized. Mild aortic valve sclerosis is present, with no evidence of aortic valve stenosis. Pulmonic Valve: The pulmonic valve was normal in structure. Pulmonic valve regurgitation is trivial. No evidence of pulmonic stenosis. Aorta: The aortic root is normal in size and structure. Venous: The inferior vena cava is dilated in size with less than 50% respiratory variability, suggesting right atrial pressure of 15 mmHg. The inferior vena cava and the hepatic vein show a normal flow pattern. IAS/Shunts: No atrial level shunt detected by color flow Doppler.  LEFT VENTRICLE PLAX 2D LVIDd:         3.90 cm LVIDs:         2.70 cm LV PW:         1.10 cm LV IVS:        1.30 cm LVOT diam:     2.00 cm LV SV:         54.66 ml LV SV Index:   17.08 LVOT Area:     3.14 cm  RIGHT VENTRICLE TAPSE (M-mode): 1.6 cm LEFT ATRIUM             Index       RIGHT ATRIUM           Index LA diam:        4.10 cm 1.82 cm/m  RA Area:     11.80 cm LA Vol (A2C):   40.8 ml 18.12 ml/m RA Volume:   23.60 ml  10.48 ml/m LA Vol (A4C):   45.5 ml 20.20 ml/m LA Biplane Vol: 43.9 ml 19.49 ml/m  AORTIC VALVE LVOT Vmax:   96.70 cm/s LVOT Vmean:  70.800 cm/s LVOT VTI:    0.174 m  AORTA Ao Root diam: 3.30 cm  SHUNTS Systemic VTI:  0.17 m Systemic Diam: 2.00 cm Fransico Him MD Electronically signed by Fransico Him MD Signature Date/Time: 03/04/2019/10:36:09 AM    Final (Updated)     Cardiac Studies   Echo 03/04/19 1. Left ventricular ejection fraction, by estimation, is 65 to 70%. The  left ventricle has normal function. The left ventrical has no regional  wall motion abnormalities. There is moderately increased left ventricular  hypertrophy of the basal-septal  segment. Left ventricular diastolic function could not be evaluated  secondary to  atrial fibrillation.  2. Right ventricular systolic function is normal. The right ventricular  size is normal. Tricuspid regurgitation signal is inadequate for assessing  PA pressure.  3. The mitral valve is normal in structure and function. trivial mitral  valve regurgitation. No evidence of mitral stenosis.  4. The aortic valve is tricuspid. Aortic valve regurgitation is not  visualized. Mild aortic valve sclerosis is present, with no evidence of  aortic valve stenosis.  5. The inferior vena cava is dilated in size with <50% respiratory  variability, suggesting right atrial pressure of 15 mmHg.   Patient Profile     79 y.o. male with a hx of CKD stage 3, HTN, DM, BPH who is being seen for new onset afib.  Assessment & Plan    Paroxysmal Afib - Patient found to be in Afib placed on dilt drip - Patient spontaneously converted to NSR. Remains in sinus. Possible brief episode of ?atrial tachycardia . MD to see - CHADSVASC 4. continue Eliquis 5 mg BID.  Held for EGD/colosconopy, would restart as soon  as able post procedure per GI - continue Oral dilt - TSH 1.342, K 4.1, MG 1.4 >>supplement - Echo showed EF 65-70%, no WMA, increased LV hypertrophy of the basal-septal segment>>plan for outpatient cardiac MRI - Patient was heme positive. Hgb 10.3>>plan for EGD/colonoscopy per GI  DM2 - SSI per IM  AKI/CKD stage 3 - creatinine improving 1.90 > 1.74> 1.69 -baseline around 1.6  HTN - Dilt started as above - Pressures slightly up - Patient is on Diovan at home  For questions or updates, please contact Elida Please consult [email or web address] for contact info under    Friendship will sign off.   Medication Recommendations:  Diltiazem CD 120 mg, restart Eliquis 5 mg twice daily once able to following procedures Other recommendations (labs, testing, etc):  None Follow up as an outpatient:  Will schedule outpatient f/u.    Signed, Cadence Ninfa Meeker, PA-C  03/06/2019,  8:01 AM    Patient seen and examined.  Agree with above documentation.On exam, patient is alert and oriented, regular rate and rhythm, no murmurs, lungs CTAB, no LE edema or JVD.  Undergoing EGD/colonoscopy today for work-up of iron deficient anemia.  Eliquis held for procedure.  He remains in sinus rhythm on telemetry.  TTE showed normal LV function.  Would continue diltiazem CD 120 mg.  Restart Eliquis as soon as able to after procedures.  Donato Heinz, MD

## 2019-03-06 NOTE — Anesthesia Procedure Notes (Signed)
Procedure Name: MAC Date/Time: 03/06/2019 12:16 PM Performed by: Trinna Post., CRNA Pre-anesthesia Checklist: Patient identified, Emergency Drugs available, Suction available, Patient being monitored and Timeout performed Patient Re-evaluated:Patient Re-evaluated prior to induction Oxygen Delivery Method: Nasal cannula Preoxygenation: Pre-oxygenation with 100% oxygen Induction Type: IV induction Placement Confirmation: positive ETCO2

## 2019-03-06 NOTE — Op Note (Signed)
[facility] Patient Name: Martin Richards Procedure Date : 03/06/2019 MRN: [id number] Attending MD: Carol Ada , MD Date of Birth: 1940/05/22 CSN: [id number] Age: 79 Admit Type: Inpatient Procedure:                Colonoscopy Indications:              Heme positive stool, Iron deficiency anemia Providers:                Carol Ada, MD, Kary Kos, RN, Jeanella Cara, RN, Elspeth Cho Tech., Technician,                            Dewitt Hoes, CRNA Referring MD:              Medicines:                Propofol per Anesthesia Complications:            No immediate complications. Estimated Blood Loss:     Estimated blood loss: none. Procedure:                Pre-Anesthesia Assessment:                           - Prior to the procedure, a History and Physical                            was performed, and patient medications and                            allergies were reviewed. The patient's tolerance of                            previous anesthesia was also reviewed. The risks                            and benefits of the procedure and the sedation                            options and risks were discussed with the patient.                            All questions were answered, and informed consent                            was obtained. Prior Anticoagulants: The patient has                            taken no previous anticoagulant or antiplatelet                            agents. ASA Grade Assessment: III - A patient with  severe systemic disease. After reviewing the risks                            and benefits, the patient was deemed in                            satisfactory condition to undergo the procedure.                           - Sedation was administered by an anesthesia                            professional. Deep sedation was attained.                           After obtaining  informed consent, the colonoscope                            was passed under direct vision. Throughout the                            procedure, the patient's blood pressure, pulse, and                            oxygen saturations were monitored continuously. The                            [id number] Olympus pediatric colonoscope                            was introduced through the anus and advanced to the                            the cecum, identified by appendiceal orifice and                            ileocecal valve. The colonoscopy was technically                            difficult and complex. The patient tolerated the                            procedure well. The quality of the bowel                            preparation was good. The ileocecal valve,                            appendiceal orifice, and rectum were photographed. Scope In: 12:33:19 PM Scope Out: 1:40:12 PM Scope Withdrawal Time: 1 hour 3 minutes 19 seconds  Total Procedure Duration: 1 hour 6 minutes 53 seconds  Findings:      Multiple sessile polyps were found in the transverse colon and ascending       colon. The polyps were 3 to 5  mm in size. These polyps were removed with       a cold snare. Resection and retrieval were complete.      Two pedunculated polyps were found in the transverse colon and ascending       colon. The polyps were 5 to 15 mm in size. These polyps were removed       with a hot snare. Resection and retrieval were complete.      A 25 mm polyp was found in the hepatic flexure. The polyp was       semi-sessile. The polyp was removed with a hot snare. The polyp was       removed with a saline injection-lift technique using a hot snare. The       polyp was removed with a piecemeal technique using a hot snare.       Resection and retrieval were complete. To prevent bleeding       post-intervention, five hemostatic clips were successfully placed (MR       unsafe). There was no  bleeding at the end of the procedure.      Scattered small-mouthed diverticula were found in the sigmoid colon and       ascending colon.      Three polyps were resected with the cold snare and intentionally not       retrieved given the current findings of his high polyp burden. The large       hepatic flexure polyp was technically difficult to removed. A fotal of       5-6 ml of O'rise was injected to lift the polyp. Piecemeal resection was       performed as the polyp was not able to be ensnared in one pass. Clean       margins were obtained with the resection and the mucosal defect was       closed with 5 Duraclips. The other two pedunculated polyps may be the       source for the anemia as one exhibited friability and the other larger       ascending colon polyp showed some evidence of bleeding. Impression:               - Multiple 3 to 5 mm polyps in the transverse colon                            and in the ascending colon, removed with a cold                            snare. Resected and retrieved.                           - Two 5 to 15 mm polyps in the transverse colon and                            in the ascending colon, removed with a hot snare.                            Resected and retrieved.                           - One 25 mm polyp at the hepatic  flexure, removed                            with a hot snare, removed using injection-lift and                            a hot snare and removed piecemeal using a hot                            snare. Resected and retrieved. Clips (MR unsafe)                            were placed.                           - Diverticulosis in the sigmoid colon and in the                            ascending colon. Recommendation:           - Patient has a contact number available for                            emergencies. The signs and symptoms of potential                            delayed complications were discussed with the                             patient. Return to normal activities tomorrow.                            Written discharge instructions were provided to the                            patient.                           - Resume previous diet.                           - Continue present medications.                           - Await pathology results.                           - Repeat colonoscopy in 6 months for surveillance. Procedure Code(s):        --- Professional ---                           [phone], Colonoscopy, flexible; with removal of                            tumor(s), polyp(s), or other lesion(s) by snare  technique                           [id number], Colonoscopy, flexible; with directed                            submucosal injection(s), any substance Diagnosis Code(s):        --- Professional ---                           K63.5, Polyp of colon                           R19.5, Other fecal abnormalities                           D50.9, Iron deficiency anemia, unspecified                           K57.30, Diverticulosis of large intestine without                            perforation or abscess without bleeding CPT copyright 2019 [employer]. All rights reserved. The codes documented in this report are preliminary and upon coder review may  be revised to meet current compliance requirements. Carol Ada, MD Carol Ada, MD 03/06/2019 1:57:11 PM This report has been signed electronically. Number of Addenda: 0

## 2019-03-07 LAB — GLUCOSE, CAPILLARY
Glucose-Capillary: 104 mg/dL — ABNORMAL HIGH (ref 70–99)
Glucose-Capillary: 114 mg/dL — ABNORMAL HIGH (ref 70–99)
Glucose-Capillary: 139 mg/dL — ABNORMAL HIGH (ref 70–99)
Glucose-Capillary: 172 mg/dL — ABNORMAL HIGH (ref 70–99)
Glucose-Capillary: 174 mg/dL — ABNORMAL HIGH (ref 70–99)
Glucose-Capillary: 92 mg/dL (ref 70–99)

## 2019-03-07 LAB — CBC WITH DIFFERENTIAL/PLATELET
Abs Immature Granulocytes: 0.03 10*3/uL (ref 0.00–0.07)
Basophils Absolute: 0 10*3/uL (ref 0.0–0.1)
Basophils Relative: 0 %
Eosinophils Absolute: 0.1 10*3/uL (ref 0.0–0.5)
Eosinophils Relative: 1 %
HCT: 32.9 % — ABNORMAL LOW (ref 39.0–52.0)
Hemoglobin: 10.1 g/dL — ABNORMAL LOW (ref 13.0–17.0)
Immature Granulocytes: 0 %
Lymphocytes Relative: 13 %
Lymphs Abs: 1.4 10*3/uL (ref 0.7–4.0)
MCH: 22.7 pg — ABNORMAL LOW (ref 26.0–34.0)
MCHC: 30.7 g/dL (ref 30.0–36.0)
MCV: 73.9 fL — ABNORMAL LOW (ref 80.0–100.0)
Monocytes Absolute: 0.7 10*3/uL (ref 0.1–1.0)
Monocytes Relative: 6 %
Neutro Abs: 8.5 10*3/uL — ABNORMAL HIGH (ref 1.7–7.7)
Neutrophils Relative %: 80 %
Platelets: 214 10*3/uL (ref 150–400)
RBC: 4.45 MIL/uL (ref 4.22–5.81)
RDW: 16.7 % — ABNORMAL HIGH (ref 11.5–15.5)
WBC: 10.7 10*3/uL — ABNORMAL HIGH (ref 4.0–10.5)
nRBC: 0 % (ref 0.0–0.2)

## 2019-03-07 LAB — BASIC METABOLIC PANEL
Anion gap: 9 (ref 5–15)
BUN: 16 mg/dL (ref 8–23)
CO2: 22 mmol/L (ref 22–32)
Calcium: 12.5 mg/dL — ABNORMAL HIGH (ref 8.9–10.3)
Chloride: 107 mmol/L (ref 98–111)
Creatinine, Ser: 1.72 mg/dL — ABNORMAL HIGH (ref 0.61–1.24)
GFR calc Af Amer: 44 mL/min — ABNORMAL LOW (ref >60)
GFR calc non Af Amer: 38 mL/min — ABNORMAL LOW (ref >60)
Glucose, Bld: 106 mg/dL — ABNORMAL HIGH (ref 70–99)
Potassium: 3.8 mmol/L (ref 3.5–5.1)
Sodium: 138 mmol/L (ref 135–145)

## 2019-03-07 LAB — MAGNESIUM: Magnesium: 1.6 mg/dL — ABNORMAL LOW (ref 1.7–2.4)

## 2019-03-07 LAB — PHOSPHORUS: Phosphorus: 2.1 mg/dL — ABNORMAL LOW (ref 2.5–4.6)

## 2019-03-07 MED ORDER — MAGNESIUM SULFATE 2 GM/50ML IV SOLN
2.0000 g | Freq: Once | INTRAVENOUS | Status: AC
  Administered 2019-03-07: 2 g via INTRAVENOUS
  Filled 2019-03-07: qty 50

## 2019-03-07 MED ORDER — K PHOS MONO-SOD PHOS DI & MONO 155-852-130 MG PO TABS
500.0000 mg | ORAL_TABLET | Freq: Three times a day (TID) | ORAL | Status: DC
  Administered 2019-03-07 – 2019-03-08 (×3): 500 mg via ORAL
  Filled 2019-03-07 (×5): qty 2

## 2019-03-07 NOTE — Plan of Care (Signed)

## 2019-03-07 NOTE — Progress Notes (Signed)
Physical Therapy Treatment Patient Details Name: Martin Richards MRN: [id number] DOB: [birth year] Today's Date: 03/07/2019    History of Present Illness Pt is a 79 y.o. M with significant PMH of CKD, HTN, DM who presents from PCP with orthostatic hypotension and labs consistent with dehydration. Admitted with hypercalcemia, elevated CK, hypomagnesemia, and AKI; also developed new onset a. fib.    PT Comments    Pt performed gt training and functional mobility during session this with max cues for encouragement.  He reports he feels more tired this am but denies need for further PT or follow up HHPT.  He presented with fall to the floor yesterday on to his bottom, reporting he slid out of his chair as it moved while he was trying to stand.  Pt refused HEP education to address strength and balance deficits. He reports he is returning to work on Monday.  Pt would benefit from HHPT or OPPT but he denies need for intervention.     Follow Up Recommendations  No PT follow up     Equipment Recommendations       Recommendations for Other Services       Precautions / Restrictions Precautions Precautions: Fall Restrictions Weight Bearing Restrictions: No    Mobility  Bed Mobility Overal bed mobility: Independent                Transfers Overall transfer level: Independent Equipment used: None                Ambulation/Gait Ambulation/Gait assistance: Supervision Gait Distance (Feet): 200 Feet Assistive device: None Gait Pattern/deviations: Step-through pattern;Decreased stride length;Decreased dorsiflexion - right;Decreased dorsiflexion - left Gait velocity: decreased   General Gait Details: Pt with decreased bilateral heel strike at initial contact (likely due to tight gastrocs); mild unsteadiness during gt but no overt LOB.   Stairs             Wheelchair Mobility    Modified Rankin (Stroke Patients Only)       Balance Overall balance assessment:  Mild deficits observed, not formally tested                                          Cognition Arousal/Alertness: Awake/alert Behavior During Therapy: WFL for tasks assessed/performed Overall Cognitive Status: Within Functional Limits for tasks assessed                                        Exercises      General Comments        Pertinent Vitals/Pain      Home Living                      Prior Function            PT Goals (current goals can now be found in the care plan section) Acute Rehab PT Goals Patient Stated Goal: To go back to work on Monday Potential to Achieve Goals: Good Progress towards PT goals: Progressing toward goals    Frequency    Min 3X/week      PT Plan Current plan remains appropriate    Co-evaluation              AM-PAC PT "6 Clicks" Mobility   Outcome Measure  Help needed turning from your back to your side while in a flat bed without using bedrails?: None Help needed moving from lying on your back to sitting on the side of a flat bed without using bedrails?: None Help needed moving to and from a bed to a chair (including a wheelchair)?: None Help needed standing up from a chair using your arms (e.g., wheelchair or bedside chair)?: None Help needed to walk in hospital room?: A Little Help needed climbing 3-5 steps with a railing? : A Little 6 Click Score: 22    End of Session Equipment Utilized During Treatment: Gait belt Activity Tolerance: Patient tolerated treatment well Patient left: in bed;with call bell/phone within reach;with family/visitor present Nurse Communication: Mobility status PT Visit Diagnosis: History of falling (Z91.81);Difficulty in walking, not elsewhere classified (R26.2)     Time: [id number] PT Time Calculation (min) (ACUTE ONLY): 9 min  Charges:  $Gait Training: 8-22 mins                     Erasmo Leventhal , PTA Acute Rehabilitation Services Pager  [phone] Office [phone]     Slaton Reaser Eli Hose 03/07/2019, 1:01 PM

## 2019-03-07 NOTE — Discharge Instructions (Signed)

## 2019-03-07 NOTE — Progress Notes (Signed)
PROGRESS NOTE    Martin Richards  [id number] DOB: 08/18/1940 DOA: 03/03/2019 PCP: Patient, No Pcp Per   Brief Narrative: Patient is a 79 year old male with history of hypertension, CKD stage IIIb, hypertension, anemia who was sent by his PCP the emergency department after he was found to be orthostatic, dehydrated.  It was reported that patient was not drinking or eating well since last few days.  He also lost 8 pounds of weight over the last week.  Also complained of some abdominal pain.  On presentation he was found to have hypercalcemia, AKI with creatinine of 2.5, hypophosphatemia, elevated CK.  During this hospitalization, he went into A. fib with RVR which is a new finding.  Cardiology wasconsulted. Along with hypercalcemia, he was found to have elevated PTH.  He underwent  nuclear parathyroid scan which showed left lower parathyroid gland adenoma. Underwent  EGD/colonoscopy also during this hospitalization. Assessment & Plan:   Active Problems:   Dehydration   Acute on chronic renal failure (HCC)   Hypercalcemia   Elevated LFTs   Rhabdomyolysis   Hypomagnesemia   Low phosphate levels   Essential hypertension   DM (diabetes mellitus), type 2 with complications (HCC)   CKD (chronic kidney disease), stage III   Paroxysmal atrial fibrillation (HCC)   Paroxysmal A. fib with RVR: New onset.CHA2DS2VASc score of 4.  Echocardiogram done here showed normal left ventricular ejection fraction, mild left ventricular hypertrophy, no obvious valvular abnormalities.  Cardiology following.  He converted to normal sinus rhythm.  On Cardizem for rate control. Started on eliquis.  Hypercalcemia: Report of not eating or drinking well at home with severe loss of appetite.  Calcium level slightly improved with IV fluids,calcitonin.  TSH is normal.  PTH is elevated.Low PO4 No history of smoking, chronic alcohol abuse.  No history of cancer in the family or himself.  Normal PSA.he follows with  urology for BPH. 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   Started on cinacalcet.  Given a dose of zometa on 03/06/19.  Calcium is still 12.5 today.Vit D of 27.  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 Scherrie Merritts.  He will also follow-up with Dr. Buddy Duty, endocrinology as an outpatient.  He has an appointment on February 24, 2 PM  Hypomagnesemia/hypophosphatemia:  Supplemented  Hypertension: Currently blood pressure stable.He was on diovan at home.  AKI on CKD stage IIIb: Baseline creatinine around 1.6.  Presented with creatinine of 2.5.  Kidney function improved with IV fluids.He was taking diovan at home.He should follow up with nephrology as an outpatient.  Diabetes type 2: Continue sliding scale insulin for now.  Elevated LFTs/CK :Mild. report of falling at home.  On IV fluids.   Microcytic anemia: Iron panel shows normal iron, low ferritin.  Currently H&H stable.  Underwent EGD/colonoscopy with finding of gastritis, diverticulosis, polyp.  Deconditioning/fall: requested for physical therapy evaluation.  No follow-up recommended   Nutrition Problem: Inadequate oral intake Etiology: poor appetite      DVT prophylaxis:Lovenox Code Status: Full Family Communication: Discussed with wife on phone on 03/06/19 Disposition Plan: Patient is from home.  He is not stable for discharge yet because of  Hypercalcemia.  Will be discharged home when clinically ready  Consultants: cardiology  Procedures:None  Antimicrobials:  Anti-infectives (From admission, onward)   None      Subjective:  Patient seen and examined at the  bedside this morning.  Hemodynamically stable.  Comfortable.  Denies any new complaints.  Objective: Vitals:   03/06/19 1845 03/06/19 2021 03/07/19 0407 03/07/19 0749  BP: (!) 164/83 (!) 164/87 (!) 144/69 (!) 148/65  Pulse:   75 72 71  Resp:  16 16   Temp:  97.6 F (36.4 C) 98 F (36.7 C) 98.5 F (36.9 C)  TempSrc:  Oral Oral Oral  SpO2: 98% 100% 98% 100%  Weight:   96.3 kg   Height:        Intake/Output Summary (Last 24 hours) at 03/07/2019 1034 Last data filed at 03/07/2019 0700 Gross per 24 hour  Intake 1260 ml  Output 725 ml  Net 535 ml   Filed Weights   03/04/19 0111 03/06/19 0500 03/07/19 0407  Weight: 97.6 kg 96.3 kg 96.3 kg    Examination:   General exam: Comfortable Respiratory system: Bilateral equal air entry, normal vesicular breath sounds, no wheezes or crackles  Cardiovascular system: S1 & S2 heard, RRR. No JVD, murmurs, rubs, gallops or clicks. Gastrointestinal system: Abdomen is nondistended, soft and nontender. No organomegaly or masses felt. Normal bowel sounds heard. Central nervous system: Alert and oriented. No focal neurological deficits. Extremities: No edema, no clubbing ,no cyanosis Skin: No rashes, lesions or ulcers,no icterus ,no pallor   Data Reviewed: I have personally reviewed following labs and imaging studies  CBC: Recent Labs  Lab 03/03/19 1644 03/03/19 2333 03/04/19 0550 03/05/19 0351 03/07/19 0429  WBC 8.1 10.2 12.3* 10.2 10.7*  NEUTROABS  --  6.2  --  7.6 8.5*  HGB 10.4* 10.4* 10.8* 10.3* 10.1*  HCT 34.4* 34.3* 34.9* 33.5* 32.9*  MCV 75.4* 75.6* 73.9* 73.3* 73.9*  PLT 225 219 236 237 [id number]   Basic Metabolic Panel: Recent Labs  Lab 03/03/19 1644 03/03/19 2025 03/03/19 2322 03/04/19 0550 03/05/19 0351 03/06/19 0404 03/07/19 0429  NA   < >  --  137 136 138 138 138  K   < >  --  4.3 3.6 4.1 3.8 3.8  CL   < >  --  104 104 105 104 107  CO2   < >  --  24 20* 24 23 22   GLUCOSE   < >  --  98 215* 163* 132* 106*  BUN   < >  --  21 18 15 14 16   CREATININE   < >  --  2.09* 1.90* 1.74* 1.69* 1.72*  CALCIUM   < >  --  13.0* 12.8* 12.7* 12.4* 12.5*  MG  --  1.2*  --  1.2* 1.4* 1.3*  --   PHOS  --  2.0*  --  2.1* 2.6  --   --    < > = values in this  interval not displayed.   GFR: Estimated Creatinine Clearance: 43.1 mL/min (A) (by C-G formula based on SCr of 1.72 mg/dL (H)). Liver Function Tests: Recent Labs  Lab 03/03/19 1644 03/04/19 0550 03/05/19 0351  AST 58* 59* 49*  ALT 55* 55* 47*  ALKPHOS 77 78 77  BILITOT 0.4 0.7 0.7  PROT 7.2 7.3 6.9  ALBUMIN 4.1 3.9 3.7   Recent Labs  Lab 03/03/19 1644  LIPASE 24   No results for input(s): AMMONIA in the last 168 hours. Coagulation Profile: No results for input(s): INR, PROTIME in the last 168 hours. Cardiac Enzymes: Recent Labs  Lab 03/03/19 2025 03/04/19 0550 03/05/19 0351  CKTOTAL 719* 726* 719*   BNP (last 3 results) No results  for input(s): PROBNP in the last 8760 hours. HbA1C: No results for input(s): HGBA1C in the last 72 hours. CBG: Recent Labs  Lab 03/06/19 1655 03/06/19 2019 03/07/19 0025 03/07/19 0402 03/07/19 0747  GLUCAP 142* 96 92 114* 104*   Lipid Profile: No results for input(s): CHOL, HDL, LDLCALC, TRIG, CHOLHDL, LDLDIRECT in the last 72 hours. Thyroid Function Tests: No results for input(s): TSH, T4TOTAL, FREET4, T3FREE, THYROIDAB in the last 72 hours. Anemia Panel: No results for input(s): VITAMINB12, FOLATE, FERRITIN, TIBC, IRON, RETICCTPCT in the last 72 hours. Sepsis Labs: No results for input(s): PROCALCITON, LATICACIDVEN in the last 168 hours.  Recent Results (from the past 240 hour(s))  SARS CORONAVIRUS 2 (TAT 6-24 HRS) Nasopharyngeal Nasopharyngeal Swab     Status: None   Collection Time: 03/03/19  7:29 PM   Specimen: Nasopharyngeal Swab  Result Value Ref Range Status   SARS Coronavirus 2 NEGATIVE NEGATIVE Final    Comment: (NOTE) SARS-CoV-2 target nucleic acids are NOT DETECTED. The SARS-CoV-2 RNA is generally detectable in upper and lower respiratory specimens during the acute phase of infection. Negative results do not preclude SARS-CoV-2 infection, do not rule out co-infections with other pathogens, and should not be used  as the sole basis for treatment or other patient management decisions. Negative results must be combined with clinical observations, patient history, and epidemiological information. The expected result is Negative. Fact Sheet for Patients: SugarRoll.be Fact Sheet for Healthcare Providers: https://www.woods-mathews.com/ This test is not yet approved or cleared by the [place] FDA and  has been authorized for detection and/or diagnosis of SARS-CoV-2 by FDA under an Emergency Use Authorization (EUA). This EUA will remain  in effect (meaning this test can be used) for the duration of the COVID-19 declaration under Section 56 4(b)(1) of the Act, 21 U.S.C. section 360bbb-3(b)(1), unless the authorization is terminated or revoked sooner. Performed at [facility], [place] [address], [place], [place] [address]          Radiology Studies: NM Parathyroid W/Spect  Result Date: 03/06/2019 CLINICAL DATA:  Hyperparathyroidism, elevated parathormone and calcium levels EXAM: NM PARATHYROID SCINTIGRAPHY AND SPECT IMAGING TECHNIQUE: Following intravenous administration of radiopharmaceutical, early and 2-hour delayed planar images were obtained in the anterior projection. Delayed triplanar SPECT images were also obtained at 2 hours. RADIOPHARMACEUTICALS:  24 mCi Tc-10m Sestamibi IV COMPARISON:  None FINDINGS: Planar imaging: Initial distribution of sestamibi is slightly asymmetric greater at inferior LEFT lobe than RIGHT. Normal washout of tracer on delayed images with focal abnormal sestamibi retention at the inferior pole of the LEFT thyroid lobe. SPECT imaging: Abnormal sestamibi retention at the position of the LEFT inferior parathyroid gland consistent with parathyroid adenoma. No abnormal tracer retention is seen at the sites of the remaining parathyroid glands. No ectopic localization of tracer within the mediastinum. IMPRESSION: Positive parathyroid  scan for presence of a LEFT inferior parathyroid adenoma. Electronically Signed   By: Lavonia Dana M.D.   On: 03/06/2019 12:56        Scheduled Meds: . apixaban  5 mg Oral BID  . cinacalcet  30 mg Oral Q breakfast  . diltiazem  120 mg Oral Daily  . feeding supplement (ENSURE ENLIVE)  237 mL Oral BID BM  . insulin aspart  0-9 Units Subcutaneous Q4H  . multivitamin with minerals  1 tablet Oral Daily  . sodium chloride flush  3 mL Intravenous Once   Continuous Infusions: . sodium chloride 100 mL/hr at 03/06/19 [id number]  LOS: 4 days    Time spent: 35 mins,More than 50% of that time was spent in counseling and/or coordination of care.      Shelly Coss, MD [facility] P2/13/2021, 10:34 AM

## 2019-03-07 NOTE — Progress Notes (Signed)
Attempt x 1. Unsuccessful. Reassessed x 1.  Catheter in tact when removed. Pt tolerated well. Consulted IV team.

## 2019-03-07 NOTE — Progress Notes (Signed)
Covering [place] for weekend.  IDA, egd/colon 2/12.  Large colon polyp removed by EMR.  If no post-procedure overt bleeding, patient can be discharged home from [facility]  - Dr. Benson Norway will contact patient will Bx results and follow up plan.   Wilfrid Lund, MD    [facility]

## 2019-03-08 LAB — BASIC METABOLIC PANEL
Anion gap: 13 (ref 5–15)
BUN: 19 mg/dL (ref 8–23)
CO2: 21 mmol/L — ABNORMAL LOW (ref 22–32)
Calcium: 11.7 mg/dL — ABNORMAL HIGH (ref 8.9–10.3)
Chloride: 102 mmol/L (ref 98–111)
Creatinine, Ser: 1.8 mg/dL — ABNORMAL HIGH (ref 0.61–1.24)
GFR calc Af Amer: 41 mL/min — ABNORMAL LOW (ref >60)
GFR calc non Af Amer: 36 mL/min — ABNORMAL LOW (ref >60)
Glucose, Bld: 153 mg/dL — ABNORMAL HIGH (ref 70–99)
Potassium: 3.6 mmol/L (ref 3.5–5.1)
Sodium: 136 mmol/L (ref 135–145)

## 2019-03-08 LAB — GLUCOSE, CAPILLARY
Glucose-Capillary: 130 mg/dL — ABNORMAL HIGH (ref 70–99)
Glucose-Capillary: 147 mg/dL — ABNORMAL HIGH (ref 70–99)
Glucose-Capillary: 156 mg/dL — ABNORMAL HIGH (ref 70–99)
Glucose-Capillary: 168 mg/dL — ABNORMAL HIGH (ref 70–99)

## 2019-03-08 LAB — MAGNESIUM: Magnesium: 1.4 mg/dL — ABNORMAL LOW (ref 1.7–2.4)

## 2019-03-08 LAB — PHOSPHORUS: Phosphorus: 2.2 mg/dL — ABNORMAL LOW (ref 2.5–4.6)

## 2019-03-08 MED ORDER — PANTOPRAZOLE SODIUM 40 MG PO TBEC: 40.0000 mg | DELAYED_RELEASE_TABLET | Freq: Every day | ORAL | 1 refills | Status: AC

## 2019-03-08 MED ORDER — CINACALCET HCL 30 MG PO TABS: 30.0000 mg | ORAL_TABLET | Freq: Every day | ORAL | 0 refills | Status: DC

## 2019-03-08 MED ORDER — MAGNESIUM SULFATE 4 GM/100ML IV SOLN
4.0000 g | Freq: Once | INTRAVENOUS | Status: AC
  Administered 2019-03-08: 4 g via INTRAVENOUS
  Filled 2019-03-08: qty 100

## 2019-03-08 MED ORDER — K PHOS MONO-SOD PHOS DI & MONO 155-852-130 MG PO TABS: 250.0000 mg | ORAL_TABLET | Freq: Three times a day (TID) | ORAL | 0 refills | Status: AC

## 2019-03-08 MED ORDER — VITAMIN D3 25 MCG PO TABS: 1000.0000 [IU] | ORAL_TABLET | Freq: Every day | ORAL | 1 refills | Status: AC

## 2019-03-08 MED ORDER — APIXABAN 5 MG PO TABS: 5.0000 mg | ORAL_TABLET | Freq: Two times a day (BID) | ORAL | 1 refills | Status: DC

## 2019-03-08 MED ORDER — MAGNESIUM OXIDE 400 (241.3 MG) MG PO TABS: 800.0000 mg | ORAL_TABLET | Freq: Every day | ORAL | Status: DC

## 2019-03-08 MED ORDER — VITAMIN D 25 MCG (1000 UNIT) PO TABS
1000.0000 [IU] | ORAL_TABLET | Freq: Every day | ORAL | Status: DC
  Administered 2019-03-08: 1000 [IU] via ORAL
  Filled 2019-03-08: qty 1

## 2019-03-08 MED ORDER — DILTIAZEM HCL ER COATED BEADS 120 MG PO CP24: 120.0000 mg | ORAL_CAPSULE | Freq: Every day | ORAL | 1 refills | Status: DC

## 2019-03-08 MED ORDER — MAGNESIUM OXIDE 400 (241.3 MG) MG PO TABS: 800.0000 mg | ORAL_TABLET | Freq: Every day | ORAL | 2 refills | Status: AC

## 2019-03-08 NOTE — Plan of Care (Signed)

## 2019-03-08 NOTE — Discharge Summary (Signed)
Physician Discharge Summary  Martin Richards [id number] DOB: 1940-02-10 DOA: 03/03/2019  PCP: Patient, No Pcp Per  Admit date: 03/03/2019 Discharge date: 03/08/2019  Admitted From: Home Disposition:  Home  Discharge Condition:Stable CODE STATUS:FULL Diet recommendation: Heart Healthy   Brief/Interim Summary:  Patient is a 79 year old male with history of hypertension, CKD stage IIIb, hypertension, anemia who was sent by his PCP the emergency department after he was found to be orthostatic, dehydrated.  It was reported that patient was not drinking or eating well since last few days.  He also lost 8 pounds of weight over the last week.  Also complained of some abdominal pain.  On presentation he was found to have hypercalcemia, AKI with creatinine of 2.5, hypophosphatemia, elevated CK.  During this hospitalization, he went into A. fib with RVR which is a new finding.  Cardiology was consulted. Along with hypercalcemia, he was found to have elevated PTH.  He underwent  nuclear parathyroid scan which showed left lower parathyroid gland adenoma. Underwent  EGD/colonoscopy also during this hospitalization for evaluation of anemia.  Currently he is hemodynamically stable.  Calcium level has improved with zoledronic acid.  He is currently in normal sinus rhythm.  He will follow-up with general surgery, endocrinology, nephrology, cardiology as an outpatient.  Following problems were addressed during his hospitalization:  Hypercalcemia: Report of not eating or drinking well at home with severe loss of appetite,dehydration.  Calcium level has improved with Zometa.    PTH is elevated.Low PO4.Mg No history of smoking, chronic alcohol abuse.  No history of cancer in the family or himself.  Normal PSA.he follows with urology for BPH. 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   Started on  cinacalcet.  Given a dose of zometa on 03/06/19.  Calcium in the range of 11 today.Vit D of 27.  Start on vitamin D supplementation. Case was discussed with general surgery, endocrinology.  He will follow-up as an outpatient.  Paroxysmal A. fib with RVR: New onset.CHA2DS2VASc score of 4.  Echocardiogram done here showed normal left ventricular ejection fraction, mild left ventricular hypertrophy, no obvious valvular abnormalities.  Cardiology following.  He converted to normal sinus rhythm.  On Cardizem for rate control. Started on eliquis.  Hypomagnesemia/hypophosphatemia:  Continue supplementation.  Check magnesium and phosphorus level in a week.  Hypertension: Currently blood pressure acceptable.He was on diovan at home.  Started on Cardizem  AKI on CKD stage IIIb: Baseline creatinine around 1.6.  Presented with creatinine of 2.5.  Kidney function improved with IV fluids.He was taking diovan at home.He should follow up with nephrology as an outpatient in 2 weeks.  Diabetes type 2: Continue home regimen  Elevated LFTs/CK :Mild. report of falling at home.    Treated with IV fluids.   Microcytic anemia: Iron panel showed normal iron, low ferritin.  Currently H&H stable.  Underwent EGD/colonoscopy with finding of gastritis, diverticulosis, polyp.  Started on Protonix  Deconditioning/fall: Physical therapy evaluation completed.  No follow-up recommended  Discharge Diagnoses:  Active Problems:   Dehydration   Acute on chronic renal failure (HCC)   Hypercalcemia   Elevated LFTs   Rhabdomyolysis   Hypomagnesemia   Low phosphate levels   Essential hypertension   DM (diabetes mellitus), type 2 with complications (HCC)   CKD (chronic kidney disease), stage III   Paroxysmal atrial fibrillation [facility])    Discharge Instructions  Discharge Instructions    Ambulatory  referral to Nephrology   Complete by: As directed    Diet - low sodium heart healthy   Complete by: As directed     Discharge instructions   Complete by: As directed    1)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  You need to follow-up with your PCP in a week and do following test: CBC, BMP, magnesium, phosphorus 2) you need to follow-up with Dr. Delana Meyer, general surgery, as soon as possible.  Name and number of the provider has been attached.  Call for  Appointment. 3) you also need to follow-up with endocrinologist, Dr. Buddy Duty.  You have an appointment on February 24, 2 PM.  Name and number of the provider has been attached.  Call the phone number to make sure about the given appointment date. 4)Please follow-up with nephrology in 2 weeks.  Name and number of the provider has been attached. 5)You also have a new diagnosis of paroxysmal atrial fibrillation.  You have a follow-up appointment with Dr. Gardiner Rhyme, Cardiology, on March 13, 3 PM 6) please take prescribed medications as instructed.   Increase activity slowly   Complete by: As directed      Allergies as of 03/08/2019   No Known Allergies     Medication List    STOP taking these medications   dextromethorphan-guaiFENesin 30-600 MG 12hr tablet Commonly known as: MUCINEX DM   DIOVAN HCT PO   oseltamivir 75 MG capsule Commonly known as: TAMIFLU     TAKE these medications   allopurinol 100 MG tablet Commonly known as: ZYLOPRIM Take 100 mg by mouth daily.   apixaban 5 MG Tabs tablet Commonly known as: ELIQUIS Take 1 tablet (5 mg total) by mouth 2 (two) times daily.   cinacalcet 30 MG tablet Commonly known as: SENSIPAR Take 1 tablet (30 mg total) by mouth daily with breakfast. Start taking on: March 09, 2019   diltiazem 120 MG 24 hr capsule Commonly known as: CARDIZEM CD Take 1 capsule (120 mg total) by mouth daily. Start taking on: March 09, 2019   magnesium oxide 400 (241.3 Mg) MG tablet Commonly known as: MAG-OX Take 2 tablets (800 mg total) by mouth daily. Start taking on: March 09, 2019   multivitamin with minerals Tabs tablet Take 1 tablet by mouth daily.   pantoprazole 40 MG tablet Commonly known as: Protonix Take 1 tablet (40 mg total) by mouth daily.   phosphorus 155-852-130 MG tablet Commonly known as: K PHOS NEUTRAL Take 1 tablet (250 mg total) by mouth 3 (three) times daily.   Vitamin D3 25 MCG tablet Commonly known as: Vitamin D Take 1 tablet (1,000 Units total) by mouth daily. Start taking on: March 09, 2019      Follow-up Information    Donato Heinz, MD Follow up on 04/06/2019.   Specialty: Cardiology Why: Please go to hospital follow up March 15th at 3:00 PM Contact information: [address] [place] [address] [phone]        Armandina Gemma, MD Follow up.   Specialty: General Surgery Why: You can call to schedule an appointment to see Dr. Harlow Asa for your parathyroid adenoma Contact information: [place] [address] [phone]        Rosita Fire, MD. Schedule an appointment as soon as possible for a visit in 2 week(s).   Specialties: Nephrology, Internal Medicine Contact information: [address] [place] [address] [phone]  Delrae Rend, MD. Schedule an appointment as soon as possible for a visit in 1 week(s).   Specialty: Endocrinology Contact information: [address] [place] [address] [phone]          No Known Allergies  Consultations:  Patient seen and examined at the bedside this morning.  Hemodynamically stable for discharge.   Procedures/Studies: DG Chest 2 View  Result Date: 03/03/2019 CLINICAL DATA:  Weakness. Dehydration. EXAM: CHEST - 2 VIEW COMPARISON:  Chest radiograph 02/27/2018 FINDINGS: The cardiomediastinal contours are normal. The lungs are clear. Pulmonary vasculature is normal. No consolidation, pleural effusion, or pneumothorax. No acute osseous abnormalities are seen.  Degenerative change in the spine. IMPRESSION: No acute chest findings. Electronically Signed   By: Keith Rake M.D.   On: 03/03/2019 20:36   NM Parathyroid W/Spect  Result Date: 03/06/2019 CLINICAL DATA:  Hyperparathyroidism, elevated parathormone and calcium levels EXAM: NM PARATHYROID SCINTIGRAPHY AND SPECT IMAGING TECHNIQUE: Following intravenous administration of radiopharmaceutical, early and 2-hour delayed planar images were obtained in the anterior projection. Delayed triplanar SPECT images were also obtained at 2 hours. RADIOPHARMACEUTICALS:  24 mCi Tc-7m Sestamibi IV COMPARISON:  None FINDINGS: Planar imaging: Initial distribution of sestamibi is slightly asymmetric greater at inferior LEFT lobe than RIGHT. Normal washout of tracer on delayed images with focal abnormal sestamibi retention at the inferior pole of the LEFT thyroid lobe. SPECT imaging: Abnormal sestamibi retention at the position of the LEFT inferior parathyroid gland consistent with parathyroid adenoma. No abnormal tracer retention is seen at the sites of the remaining parathyroid glands. No ectopic localization of tracer within the mediastinum. IMPRESSION: Positive parathyroid scan for presence of a LEFT inferior parathyroid adenoma. Electronically Signed   By: Lavonia Dana M.D.   On: 03/06/2019 12:56   ECHOCARDIOGRAM COMPLETE  Result Date: 03/04/2019    ECHOCARDIOGRAM REPORT   Patient Name:   Martin Richards Date of Exam: 03/04/2019 Medical Rec #:  [id number]         Height:       74.5 in Accession #:    [id number]        Weight:       215.1 lb Date of Birth:  Jun 29, 1940        BSA:          2.25 m Patient Age:    68 years          BP:           128/79 mmHg Patient Gender: M                 HR:           96 bpm. Exam Location:  Inpatient Procedure: 2D Echo, Cardiac Doppler and Color Doppler Indications:     Dyspnea 786.09  History:         Patient has no prior history of Echocardiogram examinations.                  Risk  Factors:Hypertension, Diabetes and Non-Smoker.  Sonographer:     Vickie Epley RDCS Referring Phys:  [id number] Kadince Boxley Diagnosing Phys: Fransico Him MD IMPRESSIONS  1. Left ventricular ejection fraction, by estimation, is 65 to 70%. The left ventricle has normal function. The left ventrical has no regional wall motion abnormalities. There is moderately increased left ventricular hypertrophy of the basal-septal segment. Left ventricular diastolic function could not be evaluated secondary to atrial fibrillation.  2. Right ventricular systolic function is normal. The  right ventricular size is normal. Tricuspid regurgitation signal is inadequate for assessing PA pressure.  3. The mitral valve is normal in structure and function. trivial mitral valve regurgitation. No evidence of mitral stenosis.  4. The aortic valve is tricuspid. Aortic valve regurgitation is not visualized. Mild aortic valve sclerosis is present, with no evidence of aortic valve stenosis.  5. The inferior vena cava is dilated in size with <50% respiratory variability, suggesting right atrial pressure of 15 mmHg. FINDINGS  Left Ventricle: Left ventricular ejection fraction, by estimation, is 65 to 70%. The left ventricle has normal function. The left ventricle has no regional wall motion abnormalities. The left ventricular internal cavity size was normal in size. There is  moderately increased left ventricular hypertrophy of the basal-septal segment. The left ventricular diastology could not be evaluated due to atrial fibrillation. Left ventricular diastolic function could not be evaluated. Right Ventricle: The right ventricular size is normal. No increase in right ventricular wall thickness. Right ventricular systolic function is normal. Tricuspid regurgitation signal is inadequate for assessing PA pressure. Left Atrium: Left atrial size was normal in size. Right Atrium: Right atrial size was normal in size. Pericardium: There is no evidence of  pericardial effusion. Mitral Valve: The mitral valve is normal in structure and function. Normal mobility of the mitral valve leaflets. Trivial mitral valve regurgitation. No evidence of mitral valve stenosis. Tricuspid Valve: The tricuspid valve is normal in structure. Tricuspid valve regurgitation is not demonstrated. No evidence of tricuspid stenosis. Aortic Valve: The aortic valve is tricuspid. Aortic valve regurgitation is not visualized. Mild aortic valve sclerosis is present, with no evidence of aortic valve stenosis. Pulmonic Valve: The pulmonic valve was normal in structure. Pulmonic valve regurgitation is trivial. No evidence of pulmonic stenosis. Aorta: The aortic root is normal in size and structure. Venous: The inferior vena cava is dilated in size with less than 50% respiratory variability, suggesting right atrial pressure of 15 mmHg. The inferior vena cava and the hepatic vein show a normal flow pattern. IAS/Shunts: No atrial level shunt detected by color flow Doppler.  LEFT VENTRICLE PLAX 2D LVIDd:         3.90 cm LVIDs:         2.70 cm LV PW:         1.10 cm LV IVS:        1.30 cm LVOT diam:     2.00 cm LV SV:         54.66 ml LV SV Index:   17.08 LVOT Area:     3.14 cm  RIGHT VENTRICLE TAPSE (M-mode): 1.6 cm LEFT ATRIUM             Index       RIGHT ATRIUM           Index LA diam:        4.10 cm 1.82 cm/m  RA Area:     11.80 cm LA Vol (A2C):   40.8 ml 18.12 ml/m RA Volume:   23.60 ml  10.48 ml/m LA Vol (A4C):   45.5 ml 20.20 ml/m LA Biplane Vol: 43.9 ml 19.49 ml/m  AORTIC VALVE LVOT Vmax:   96.70 cm/s LVOT Vmean:  70.800 cm/s LVOT VTI:    0.174 m  AORTA Ao Root diam: 3.30 cm  SHUNTS Systemic VTI:  0.17 m Systemic Diam: 2.00 cm Fransico Him MD Electronically signed by Fransico Him MD Signature Date/Time: 03/04/2019/10:36:09 AM    Final (Updated)  Subjective:   Discharge Exam: Vitals:   03/07/19 2212 03/08/19 0410  BP: (!) 166/58 (!) 152/58  Pulse: 86 83  Resp: 16 16  Temp:  (!) 100.5 F (38.1 C) 99.9 F (37.7 C)  SpO2: 100% 98%   Vitals:   03/07/19 1557 03/07/19 1600 03/07/19 2212 03/08/19 0410  BP: (!) 148/53 (!) 148/53 (!) 166/58 (!) 152/58  Pulse: 80  86 83  Resp: 17  16 16   Temp: 99.6 F (37.6 C)  (!) 100.5 F (38.1 C) 99.9 F (37.7 C)  TempSrc: Oral  Oral Oral  SpO2: 99%  100% 98%  Weight:    96.6 kg  Height:        General: Pt is alert, awake, not in acute distress Cardiovascular: RRR, S1/S2 +, no rubs, no gallops Respiratory: CTA bilaterally, no wheezing, no rhonchi Abdominal: Soft, NT, ND, bowel sounds + Extremities: no edema, no cyanosis    The results of significant diagnostics from this hospitalization (including imaging, microbiology, ancillary and laboratory) are listed below for reference.     Microbiology: Recent Results (from the past 240 hour(s))  SARS CORONAVIRUS 2 (TAT 6-24 HRS) Nasopharyngeal Nasopharyngeal Swab     Status: None   Collection Time: 03/03/19  7:29 PM   Specimen: Nasopharyngeal Swab  Result Value Ref Range Status   SARS Coronavirus 2 NEGATIVE NEGATIVE Final    Comment: (NOTE) SARS-CoV-2 target nucleic acids are NOT DETECTED. The SARS-CoV-2 RNA is generally detectable in upper and lower respiratory specimens during the acute phase of infection. Negative results do not preclude SARS-CoV-2 infection, do not rule out co-infections with other pathogens, and should not be used as the sole basis for treatment or other patient management decisions. Negative results must be combined with clinical observations, patient history, and epidemiological information. The expected result is Negative. Fact Sheet for Patients: SugarRoll.be Fact Sheet for Healthcare Providers: https://www.woods-mathews.com/ This test is not yet approved or cleared by the [place] FDA and  has been authorized for detection and/or diagnosis of SARS-CoV-2 by FDA under an Emergency Use  Authorization (EUA). This EUA will remain  in effect (meaning this test can be used) for the duration of the COVID-19 declaration under Section 56 4(b)(1) of the Act, 21 U.S.C. section 360bbb-3(b)(1), unless the authorization is terminated or revoked sooner. Performed at [facility], [place] [address] [place], [place] [address]      Labs: BNP (last 3 results) No results for input(s): BNP in the last 8760 hours. Basic Metabolic Panel: Recent Labs  Lab 03/03/19 2025 03/03/19 2322 03/04/19 0550 03/05/19 0351 03/06/19 0404 03/07/19 0429 03/08/19 0420  NA  --    < > 136 138 138 138 136  K  --    < > 3.6 4.1 3.8 3.8 3.6  CL  --    < > 104 105 104 107 102  CO2  --    < > 20* 24 23 22  21*  GLUCOSE  --    < > 215* 163* 132* 106* 153*  BUN  --    < > 18 15 14 16 19   CREATININE  --    < > 1.90* 1.74* 1.69* 1.72* 1.80*  CALCIUM  --    < > 12.8* 12.7* 12.4* 12.5* 11.7*  MG 1.2*   < > 1.2* 1.4* 1.3* 1.6* 1.4*  PHOS 2.0*  --  2.1* 2.6  --  2.1* 2.2*   < > = values in this interval not displayed.  Liver Function Tests: Recent Labs  Lab 03/03/19 1644 03/04/19 0550 03/05/19 0351  AST 58* 59* 49*  ALT 55* 55* 47*  ALKPHOS 77 78 77  BILITOT 0.4 0.7 0.7  PROT 7.2 7.3 6.9  ALBUMIN 4.1 3.9 3.7   Recent Labs  Lab 03/03/19 1644  LIPASE 24   No results for input(s): AMMONIA in the last 168 hours. CBC: Recent Labs  Lab 03/03/19 1644 03/03/19 2333 03/04/19 0550 03/05/19 0351 03/07/19 0429  WBC 8.1 10.2 12.3* 10.2 10.7*  NEUTROABS  --  6.2  --  7.6 8.5*  HGB 10.4* 10.4* 10.8* 10.3* 10.1*  HCT 34.4* 34.3* 34.9* 33.5* 32.9*  MCV 75.4* 75.6* 73.9* 73.3* 73.9*  PLT 225 219 236 237 214   Cardiac Enzymes: Recent Labs  Lab 03/03/19 2025 03/04/19 0550 03/05/19 0351  CKTOTAL 719* 726* 719*   BNP: Invalid input(s): POCBNP CBG: Recent Labs  Lab 03/07/19 1606 03/07/19 2006 03/08/19 0010 03/08/19 0407 03/08/19 0737  GLUCAP 139* 174* 130* 147* 156*   D-Dimer No  results for input(s): DDIMER in the last 72 hours. Hgb A1c No results for input(s): HGBA1C in the last 72 hours. Lipid Profile No results for input(s): CHOL, HDL, LDLCALC, TRIG, CHOLHDL, LDLDIRECT in the last 72 hours. Thyroid function studies No results for input(s): TSH, T4TOTAL, T3FREE, THYROIDAB in the last 72 hours.  Invalid input(s): FREET3 Anemia work up No results for input(s): VITAMINB12, FOLATE, FERRITIN, TIBC, IRON, RETICCTPCT in the last 72 hours. Urinalysis    Component Value Date/Time   COLORURINE YELLOW 03/03/2019 2152   APPEARANCEUR CLEAR 03/03/2019 2152   LABSPEC 1.013 03/03/2019 2152   PHURINE 7.0 03/03/2019 2152   GLUCOSEU NEGATIVE 03/03/2019 2152   HGBUR NEGATIVE 03/03/2019 2152   BILIRUBINUR NEGATIVE 03/03/2019 2152   KETONESUR NEGATIVE 03/03/2019 2152   PROTEINUR NEGATIVE 03/03/2019 2152   UROBILINOGEN 0.2 04/17/2009 1913   NITRITE NEGATIVE 03/03/2019 2152   LEUKOCYTESUR NEGATIVE 03/03/2019 2152   Sepsis Labs Invalid input(s): PROCALCITONIN,  WBC,  LACTICIDVEN Microbiology Recent Results (from the past 240 hour(s))  SARS CORONAVIRUS 2 (TAT 6-24 HRS) Nasopharyngeal Nasopharyngeal Swab     Status: None   Collection Time: 03/03/19  7:29 PM   Specimen: Nasopharyngeal Swab  Result Value Ref Range Status   SARS Coronavirus 2 NEGATIVE NEGATIVE Final    Comment: (NOTE) SARS-CoV-2 target nucleic acids are NOT DETECTED. The SARS-CoV-2 RNA is generally detectable in upper and lower respiratory specimens during the acute phase of infection. Negative results do not preclude SARS-CoV-2 infection, do not rule out co-infections with other pathogens, and should not be used as the sole basis for treatment or other patient management decisions. Negative results must be combined with clinical observations, patient history, and epidemiological information. The expected result is Negative. Fact Sheet for Patients: SugarRoll.be Fact Sheet  for Healthcare Providers: https://www.woods-mathews.com/ This test is not yet approved or cleared by the [place] FDA and  has been authorized for detection and/or diagnosis of SARS-CoV-2 by FDA under an Emergency Use Authorization (EUA). This EUA will remain  in effect (meaning this test can be used) for the duration of the COVID-19 declaration under Section 56 4(b)(1) of the Act, 21 U.S.C. section 360bbb-3(b)(1), unless the authorization is terminated or revoked sooner. Performed at [facility], [place] [address] [place], [place] [address]     Please note: You were cared for by a hospitalist during your hospital stay. Once you are discharged, your primary care physician will handle any further  medical issues. Please note that NO REFILLS for any discharge medications will be authorized once you are discharged, as it is imperative that you return to your primary care physician (or establish a relationship with a primary care physician if you do not have one) for your post hospital discharge needs so that they can reassess your need for medications and monitor your lab values.    Time coordinating discharge: 40 minutes  SIGNED:   Shelly Coss, MD  [facility] 03/08/2019, 10:38 AM Pager [phone]  If 7PM-7AM, please contact night-coverage [email or web address] Password [email or web address]

## 2019-03-08 NOTE — Plan of Care (Signed)

## 2019-03-08 NOTE — Plan of Care (Signed)
  Problem: Education: Goal: Knowledge of General Education information will improve Description: Including pain rating scale, medication(s)/side effects and non-pharmacologic comfort measures 03/08/2019 1131 by Camillia Herter, RN Outcome: Adequate for Discharge 03/08/2019 0749 by Camillia Herter, RN Outcome: Progressing   Problem: Health Behavior/Discharge Planning: Goal: Ability to manage health-related needs will improve 03/08/2019 1131 by Camillia Herter, RN Outcome: Adequate for Discharge 03/08/2019 0749 by Camillia Herter, RN Outcome: Progressing   Problem: Clinical Measurements: Goal: Ability to maintain clinical measurements within normal limits will improve 03/08/2019 1131 by Camillia Herter, RN Outcome: Adequate for Discharge 03/08/2019 0749 by Camillia Herter, RN Outcome: Progressing Goal: Will remain free from infection 03/08/2019 1131 by Camillia Herter, RN Outcome: Adequate for Discharge 03/08/2019 0749 by Camillia Herter, RN Outcome: Progressing Goal: Diagnostic test results will improve 03/08/2019 1131 by Camillia Herter, RN Outcome: Adequate for Discharge 03/08/2019 0749 by Camillia Herter, RN Outcome: Progressing Goal: Respiratory complications will improve 03/08/2019 1131 by Camillia Herter, RN Outcome: Adequate for Discharge 03/08/2019 0749 by Camillia Herter, RN Outcome: Progressing Goal: Cardiovascular complication will be avoided 03/08/2019 1131 by Camillia Herter, RN Outcome: Adequate for Discharge 03/08/2019 0749 by Camillia Herter, RN Outcome: Progressing   Problem: Activity: Goal: Risk for activity intolerance will decrease 03/08/2019 1131 by Camillia Herter, RN Outcome: Adequate for Discharge 03/08/2019 0749 by Camillia Herter, RN Outcome: Progressing   Problem: Nutrition: Goal: Adequate nutrition will be maintained 03/08/2019 1131 by Camillia Herter, RN Outcome: Adequate for Discharge 03/08/2019 0749 by Camillia Herter, RN Outcome: Progressing   Problem:  Coping: Goal: Level of anxiety will decrease 03/08/2019 1131 by Camillia Herter, RN Outcome: Adequate for Discharge 03/08/2019 0749 by Camillia Herter, RN Outcome: Progressing   Problem: Elimination: Goal: Will not experience complications related to bowel motility 03/08/2019 1131 by Camillia Herter, RN Outcome: Adequate for Discharge 03/08/2019 0749 by Camillia Herter, RN Outcome: Progressing Goal: Will not experience complications related to urinary retention 03/08/2019 1131 by Camillia Herter, RN Outcome: Adequate for Discharge 03/08/2019 0749 by Camillia Herter, RN Outcome: Progressing   Problem: Pain Managment: Goal: General experience of comfort will improve 03/08/2019 1131 by Camillia Herter, RN Outcome: Adequate for Discharge 03/08/2019 0749 by Camillia Herter, RN Outcome: Progressing   Problem: Safety: Goal: Ability to remain free from injury will improve 03/08/2019 1131 by Camillia Herter, RN Outcome: Adequate for Discharge 03/08/2019 0749 by Camillia Herter, RN Outcome: Progressing   Problem: Skin Integrity: Goal: Risk for impaired skin integrity will decrease 03/08/2019 1131 by Camillia Herter, RN Outcome: Adequate for Discharge 03/08/2019 0749 by Camillia Herter, RN Outcome: Progressing

## 2019-03-09 LAB — SURGICAL PATHOLOGY

## 2019-03-13 LAB — PTH-RELATED PEPTIDE: PTH-related peptide: <2 pmol/L

## 2019-03-19 ENCOUNTER — Ambulatory Visit: Payer: Self-pay | Admitting: Surgery

## 2019-03-19 MED ORDER — SODIUM CHLORIDE 0.9 % IV SOLN
INTRAVENOUS | Status: DC
Start: ? — End: 2019-03-19

## 2019-03-19 MED ORDER — CINACALCET HCL 30 MG PO TABS
30.00 | ORAL_TABLET | ORAL | Status: DC
Start: 2019-03-19 — End: 2019-03-19

## 2019-03-19 MED ORDER — PANTOPRAZOLE SODIUM 40 MG PO TBEC
40.00 | DELAYED_RELEASE_TABLET | ORAL | Status: DC
Start: 2019-03-19 — End: 2019-03-19

## 2019-03-19 MED ORDER — DILTIAZEM HCL ER BEADS 120 MG PO CP24
120.00 | ORAL_CAPSULE | ORAL | Status: DC
Start: 2019-03-18 — End: 2019-03-19

## 2019-03-19 MED ORDER — ALLOPURINOL 100 MG PO TABS
100.00 | ORAL_TABLET | ORAL | Status: DC
Start: 2019-03-19 — End: 2019-03-19

## 2019-03-19 MED ORDER — CHOLECALCIFEROL 25 MCG (1000 UT) PO TABS
1000.00 | ORAL_TABLET | ORAL | Status: DC
Start: 2019-03-19 — End: 2019-03-19

## 2019-03-19 MED ORDER — K PHOS MONO-SOD PHOS DI & MONO 155-852-130 MG PO TABS
250.00 | ORAL_TABLET | ORAL | Status: DC
Start: 2019-03-18 — End: 2019-03-19

## 2019-03-19 MED ORDER — FINASTERIDE 5 MG PO TABS
5.00 | ORAL_TABLET | ORAL | Status: DC
Start: 2019-03-19 — End: 2019-03-19

## 2019-03-23 ENCOUNTER — Other Ambulatory Visit: Payer: Self-pay | Admitting: Surgery

## 2019-03-23 DIAGNOSIS — E210 Primary hyperparathyroidism: Secondary | ICD-10-CM

## 2019-04-01 ENCOUNTER — Other Ambulatory Visit (HOSPITAL_BASED_OUTPATIENT_CLINIC_OR_DEPARTMENT_OTHER): Payer: Self-pay | Admitting: Nephrology

## 2019-04-01 DIAGNOSIS — N183 Chronic kidney disease, stage 3 unspecified: Secondary | ICD-10-CM

## 2019-04-01 DIAGNOSIS — E1122 Type 2 diabetes mellitus with diabetic chronic kidney disease: Secondary | ICD-10-CM

## 2019-04-02 ENCOUNTER — Ambulatory Visit
Admission: RE | Admit: 2019-04-02 | Discharge: 2019-04-02 | Disposition: A | Discharge status: Home / Self Care | Payer: Medicare HMO | Source: Ambulatory Visit | Attending: Surgery | Admitting: Surgery

## 2019-04-02 DIAGNOSIS — E210 Primary hyperparathyroidism: Secondary | ICD-10-CM

## 2019-04-05 NOTE — Progress Notes (Signed)
Cardiology Office Note:    Date:  04/06/2019   ID:  Martin Richards, DOB Aug 28, 1940, MRN [id number]  PCP:  Willey Blade, MD  Cardiologist:  Donato Heinz, MD  Electrophysiologist:  None   Referring MD: No ref. provider found   Chief Complaint  Patient presents with  . Atrial Fibrillation    History of Present Illness:    Martin Richards is a 79 y.o. male with a hx of diabetes, hypertension, CKD who presents for hospital follow-up.  He was admitted to [facility] last month with dehydration and orthostatic hypotension.  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  Labs in the ED notable for AKI with creatinine 2.5, along with hypercalcemia transaminitis.  Improved with IV fluids.  Work-up of his hypercalcemia showed elevated PTH.  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  He was started on cinacalcet and will follow up with endocrinology/general surgery as outpatient.  During admission, he went into atrial fibrillation with RVR.  This was a new diagnosis.  Rates were initially up to the 140s, improved with diltiazem drip.  He subsequently converted back to normal sinus rhythm, with rate in 60s.  He was started on Eliquis 5 mg twice daily and converted from diltiazem drip to p.o. diltiazem CD 120 mg daily.  TTE was notable for EF 65 to 70%, basal septal hypertrophy.  Cardiac MRI was planned as an outpatient to evaluate etiology (amyloidosis versus HCM versus hypertensive heart disease).  He had heme positive stool and underwent EGD/total colonoscopy.  EGD showed gastritis.  Colonoscopy showed multiple polyps, which were removed.  Since discharge from the hospital, he was readmitted in [facility] shortly after discharge with a GI bleed.  Reports that Eliquis was stopped, he received multiple transfusions, and GI bleeding resolved.  He did not undergo repeat EGD/colonoscopy.  He saw gastroenterology last week,  Eliquis was restarted.  Reports that GI felt that bleed was secondary to starting Eliquis too soon after polyps were removed.  He reports no further bleeding since restarting Eliquis.  States that he has been doing well, denies any chest pain, dyspnea, palpitations, syncope.  Does report lower extremity edema, for which his nephrologist has increased his Lasix to 40 mg twice daily.   Past Medical History:  Diagnosis Date  . Diabetes mellitus   . Gout   . Hypertension     Past Surgical History:  Procedure Laterality Date  . BIOPSY  03/06/2019   Procedure: BIOPSY;  Surgeon: Carol Ada, MD;  Location: [place];  Service: Endoscopy;;  . CHOLECYSTECTOMY    . COLONOSCOPY WITH PROPOFOL N/A 03/06/2019   Procedure: COLONOSCOPY WITH PROPOFOL;  Surgeon: Carol Ada, MD;  Location: [place];  Service: Endoscopy;  Laterality: N/A;  . ESOPHAGOGASTRODUODENOSCOPY (EGD) WITH PROPOFOL N/A 03/06/2019   Procedure: ESOPHAGOGASTRODUODENOSCOPY (EGD) WITH PROPOFOL;  Surgeon: Carol Ada, MD;  Location: [place];  Service: Endoscopy;  Laterality: N/A;  . HEMOSTASIS CLIP PLACEMENT  03/06/2019   Procedure: HEMOSTASIS CLIP PLACEMENT;  Surgeon: Carol Ada, MD;  Location: [facility] ENDOSCOPY;  Service: Endoscopy;;  . POLYPECTOMY  03/06/2019   Procedure: POLYPECTOMY;  Surgeon: Carol Ada, MD;  Location: [facility] ENDOSCOPY;  Service: Endoscopy;;  . SUBMUCOSAL LIFTING INJECTION  03/06/2019   Procedure: SUBMUCOSAL LIFTING INJECTION;  Surgeon: Carol Ada, MD;  Location: [facility] ENDOSCOPY;  Service: Endoscopy;;    Current Medications: Current Meds  Medication Sig  . allopurinol (ZYLOPRIM) 100 MG tablet Take 100  mg by mouth daily.  Marland Kitchen apixaban (ELIQUIS) 5 MG TABS tablet Take 1 tablet (5 mg total) by mouth 2 (two) times daily.  . cholecalciferol (VITAMIN D) 25 MCG tablet Take 1 tablet (1,000 Units total) by mouth daily.  . colchicine 0.6 MG tablet colchicine 0.6 mg tablet  TAKE 1 2 TABLETS BY MOUTH AS NEEDED  .  diltiazem (CARDIZEM CD) 120 MG 24 hr capsule Take 1 capsule (120 mg total) by mouth daily.  Marland Kitchen FeFum-FePoly-FA-B Cmp-C-Biot (INTEGRA PLUS) CAPS   . finasteride (PROSCAR) 5 MG tablet Take by mouth.  . furosemide (LASIX) 40 MG tablet Take 40 mg by mouth 2 (two) times daily.  . magnesium oxide (MAG-OX) 400 (241.3 Mg) MG tablet Take 2 tablets (800 mg total) by mouth daily.  . Multiple Vitamin (MULITIVITAMIN WITH MINERALS) TABS Take 1 tablet by mouth daily.  . pantoprazole (PROTONIX) 40 MG tablet Take 1 tablet (40 mg total) by mouth daily.  . phosphorus (K PHOS NEUTRAL) 155-852-130 MG tablet Take 1 tablet (250 mg total) by mouth 3 (three) times daily.     Allergies:   Patient has no known allergies.   Social History   Socioeconomic History  . Marital status: Married    Spouse name: Not on file  . Number of children: Not on file  . Years of education: Not on file  . Highest education level: Not on file  Occupational History  . Not on file  Tobacco Use  . Smoking status: Never Smoker  . Smokeless tobacco: Never Used  Substance and Sexual Activity  . Alcohol use: Yes    Comment: rare  . Drug use: No  . Sexual activity: Not on file  Other Topics Concern  . Not on file  Social History Narrative  . Not on file   Social Determinants of Health   Financial Resource Strain:   . Difficulty of Paying Living Expenses:   Food Insecurity:   . Worried About Charity fundraiser in the Last Year:   . Arboriculturist in the Last Year:   Transportation Needs:   . Film/video editor (Medical):   Marland Kitchen Lack of Transportation (Non-Medical):   Physical Activity:   . Days of Exercise per Week:   . Minutes of Exercise per Session:   Stress:   . Feeling of Stress :   Social Connections:   . Frequency of Communication with Friends and Family:   . Frequency of Social Gatherings with Friends and Family:   . Attends Religious Services:   . Active Member of Clubs or Organizations:   . Attends English as a second language teacher Meetings:   Marland Kitchen Marital Status:      Family History: The patient's family history includes Cancer in his sister; Diabetes in his brother; Hypertension in his brother, father, and mother.  ROS:   Please see the history of present illness.     All other systems reviewed and are negative.  EKGs/Labs/Other Studies Reviewed:    The following studies were reviewed today:   EKG:  EKG is  ordered today.  The ekg ordered today demonstrates normal sinus rhythm with sinus arrhythmia, rate 68 ,no ST abnormalities  TTE 03/04/19: 1. Left ventricular ejection fraction, by estimation, is 65 to 70%. The  left ventricle has normal function. The left ventrical has no regional  wall motion abnormalities. There is moderately increased left ventricular  hypertrophy of the basal-septal  segment. Left ventricular diastolic function could not be evaluated  secondary  to atrial fibrillation.  2. Right ventricular systolic function is normal. The right ventricular  size is normal. Tricuspid regurgitation signal is inadequate for assessing  PA pressure.  3. The mitral valve is normal in structure and function. trivial mitral  valve regurgitation. No evidence of mitral stenosis.  4. The aortic valve is tricuspid. Aortic valve regurgitation is not  visualized. Mild aortic valve sclerosis is present, with no evidence of  aortic valve stenosis.  5. The inferior vena cava is dilated in size with <50% respiratory  variability, suggesting right atrial pressure of 15 mmHg.   Recent Labs: 03/03/2019: TSH 1.342 03/05/2019: ALT 47 03/07/2019: Hemoglobin 10.1; Platelets 214 03/08/2019: BUN 19; Creatinine, Ser 1.80; Magnesium 1.4; Potassium 3.6; Sodium 136  Recent Lipid Panel No results found for: CHOL, TRIG, HDL, CHOLHDL, VLDL, LDLCALC, LDLDIRECT  Physical Exam:    VS:  BP 134/74   Pulse 68   Temp (!) 97.2 F (36.2 C)   Ht 6' 2.5" (1.892 m)   Wt 211 lb (95.7 kg)   SpO2 99%   BMI 26.73 kg/m      Wt Readings from Last 3 Encounters:  04/06/19 211 lb (95.7 kg)  03/08/19 212 lb 14.4 oz (96.6 kg)  02/27/18 220 lb (99.8 kg)     GEN:  Well nourished, well developed in no acute distress HEENT: Normal NECK: No JVD; No carotid bruits LYMPHATICS: No lymphadenopathy CARDIAC: RRR, no murmurs, rubs, gallops RESPIRATORY:  Clear to auscultation without rales, wheezing or rhonchi  ABDOMEN: Soft, non-tender, non-distended MUSCULOSKELETAL:  No edema; No deformity  SKIN: Warm and dry NEUROLOGIC:  Alert and oriented x 3 PSYCHIATRIC:  Normal affect   ASSESSMENT:    1. Paroxysmal atrial fibrillation (HCC)   2. LVH (left ventricular hypertrophy)   3. Current use of anticoagulant therapy   4. Essential hypertension    PLAN:    In order of problems listed above:  Paroxysmal atrial fibrillation: Diagnosed during recent admission when found to have hyperparathyroidism and dehydration.  CHA2DS2-VASc score 4 (hypertension, age x2, diabetes).  Normal LV systolic function on TTE -Continue Eliquis 5 mg twice daily.  Will check CBC to ensure stable H/H given recent GI bleed -Continue diltiazem 120 mg CD daily  LVH: Moderate basal septal hypertrophy on TTE.  Differential diagnosis includes hypertensive heart disease versus hypertrophic cardiomyopathy versus amyloidosis -Cardiac MRI for further evaluation  Hypertension: On diltiazem 120 mg daily.  Appears controlled  RTC in 3 months  Medication Adjustments/Labs and Tests Ordered: Current medicines are reviewed at length with the patient today.  Concerns regarding medicines are outlined above.  Orders Placed This Encounter  Procedures  . MR CARDIAC MORPHOLOGY W WO CONTRAST  . CBC  . Basic metabolic panel  . EKG 12-Lead   No orders of the defined types were placed in this encounter.   Patient Instructions  Medication Instructions:  Your physician recommends that you continue on your current medications as directed. Please refer to the  Current Medication list given to you today.  *If you need a refill on your cardiac medications before your next appointment, please call your pharmacy*   Lab Work: Today (Bmet, CBC)  If you have labs (blood work) drawn today and your tests are completely normal, you will receive your results only by: Marland Kitchen MyChart Message (if you have MyChart) OR . A paper copy in the mail If you have any lab test that is abnormal or we need to change your treatment, we will call you  to review the results.   Testing/Procedures: Your physician has requested that you have a cardiac MRI. Cardiac MRI uses a computer to create images of your heart as its beating, producing both still and moving pictures of your heart and major blood vessels. For further information please visit [email or web address] Please follow the instruction sheet given to you today for more information. --this must be approved by insurance prior to scheduling.   Follow-Up: At [facility], you and your health needs are our priority.  As part of our continuing mission to provide you with exceptional heart care, we have created designated Provider Care Teams.  These Care Teams include your primary Cardiologist (physician) and Advanced Practice Providers (APPs -  Physician Assistants and Nurse Practitioners) who all work together to provide you with the care you need, when you need it.  We recommend signing up for the patient portal called "MyChart".  Sign up information is provided on this After Visit Summary.  MyChart is used to connect with patients for Virtual Visits (Telemedicine).  Patients are able to view lab/test results, encounter notes, upcoming appointments, etc.  Non-urgent messages can be sent to your provider as well.   To learn more about what you can do with MyChart, go to [email or web address].    Your next appointment:   3 month(s)  The format for your next appointment:   In Person  Provider:   Oswaldo Milian,  MD        Signed, Donato Heinz, MD  04/06/2019 5:42 PM    [place]

## 2019-04-06 ENCOUNTER — Other Ambulatory Visit: Payer: Self-pay

## 2019-04-06 ENCOUNTER — Ambulatory Visit: Payer: Medicare HMO | Admitting: Cardiology

## 2019-04-06 ENCOUNTER — Encounter: Payer: Self-pay | Admitting: Cardiology

## 2019-04-06 VITALS — BP 134/74 | HR 68 | Temp 97.2°F | Ht 74.5 in | Wt 211.0 lb

## 2019-04-06 DIAGNOSIS — I517 Cardiomegaly: Secondary | ICD-10-CM | POA: Diagnosis not present

## 2019-04-06 DIAGNOSIS — I10 Essential (primary) hypertension: Secondary | ICD-10-CM | POA: Diagnosis not present

## 2019-04-06 DIAGNOSIS — Z7901 Long term (current) use of anticoagulants: Secondary | ICD-10-CM

## 2019-04-06 DIAGNOSIS — I480 Paroxysmal atrial fibrillation: Secondary | ICD-10-CM | POA: Diagnosis not present

## 2019-04-06 NOTE — Patient Instructions (Signed)
Medication Instructions:  Your physician recommends that you continue on your current medications as directed. Please refer to the Current Medication list given to you today.  *If you need a refill on your cardiac medications before your next appointment, please call your pharmacy*   Lab Work: Today (Bmet, CBC)  If you have labs (blood work) drawn today and your tests are completely normal, you will receive your results only by: Marland Kitchen MyChart Message (if you have MyChart) OR . A paper copy in the mail If you have any lab test that is abnormal or we need to change your treatment, we will call you to review the results.   Testing/Procedures: Your physician has requested that you have a cardiac MRI. Cardiac MRI uses a computer to create images of your heart as its beating, producing both still and moving pictures of your heart and major blood vessels. For further information please visit [email or web address] Please follow the instruction sheet given to you today for more information. --this must be approved by insurance prior to scheduling.   Follow-Up: At [facility], you and your health needs are our priority.  As part of our continuing mission to provide you with exceptional heart care, we have created designated Provider Care Teams.  These Care Teams include your primary Cardiologist (physician) and Advanced Practice Providers (APPs -  Physician Assistants and Nurse Practitioners) who all work together to provide you with the care you need, when you need it.  We recommend signing up for the patient portal called "MyChart".  Sign up information is provided on this After Visit Summary.  MyChart is used to connect with patients for Virtual Visits (Telemedicine).  Patients are able to view lab/test results, encounter notes, upcoming appointments, etc.  Non-urgent messages can be sent to your provider as well.   To learn more about what you can do with MyChart, go to [email or web address].     Your next appointment:   3 month(s)  The format for your next appointment:   In Person  Provider:   Oswaldo Milian, MD

## 2019-04-07 ENCOUNTER — Inpatient Hospital Stay (HOSPITAL_BASED_OUTPATIENT_CLINIC_OR_DEPARTMENT_OTHER): Admission: RE | Admit: 2019-04-07 | Payer: Medicare HMO | Source: Ambulatory Visit

## 2019-04-07 LAB — CBC
Hematocrit: 29.3 % — ABNORMAL LOW (ref 37.5–51.0)
Hemoglobin: 9.3 g/dL — ABNORMAL LOW (ref 13.0–17.7)
MCH: 24.8 pg — ABNORMAL LOW (ref 26.6–33.0)
MCHC: 31.7 g/dL (ref 31.5–35.7)
MCV: 78 fL — ABNORMAL LOW (ref 79–97)
Platelets: 347 10*3/uL (ref 150–450)
RBC: 3.75 x10E6/uL — ABNORMAL LOW (ref 4.14–5.80)
RDW: 17.9 % — ABNORMAL HIGH (ref 11.6–15.4)
WBC: 7.7 10*3/uL (ref 3.4–10.8)

## 2019-04-07 LAB — BASIC METABOLIC PANEL
BUN/Creatinine Ratio: 8 — ABNORMAL LOW (ref 10–24)
BUN: 12 mg/dL (ref 8–27)
CO2: 25 mmol/L (ref 20–29)
Calcium: 8.7 mg/dL (ref 8.6–10.2)
Chloride: 99 mmol/L (ref 96–106)
Creatinine, Ser: 1.57 mg/dL — ABNORMAL HIGH (ref 0.76–1.27)
GFR calc Af Amer: 49 mL/min/{1.73_m2} — ABNORMAL LOW (ref >59)
GFR calc non Af Amer: 42 mL/min/{1.73_m2} — ABNORMAL LOW (ref >59)
Glucose: 109 mg/dL — ABNORMAL HIGH (ref 65–99)
Potassium: 3.5 mmol/L (ref 3.5–5.2)
Sodium: 141 mmol/L (ref 134–144)

## 2019-04-09 ENCOUNTER — Ambulatory Visit (HOSPITAL_BASED_OUTPATIENT_CLINIC_OR_DEPARTMENT_OTHER)
Admission: RE | Admit: 2019-04-09 | Discharge: 2019-04-09 | Disposition: A | Discharge status: Home / Self Care | Payer: Medicare HMO | Source: Ambulatory Visit | Attending: Nephrology | Admitting: Nephrology

## 2019-04-09 ENCOUNTER — Other Ambulatory Visit: Payer: Self-pay

## 2019-04-09 DIAGNOSIS — N183 Chronic kidney disease, stage 3 unspecified: Secondary | ICD-10-CM | POA: Insufficient documentation

## 2019-04-09 DIAGNOSIS — E1122 Type 2 diabetes mellitus with diabetic chronic kidney disease: Secondary | ICD-10-CM | POA: Diagnosis present

## 2019-04-21 ENCOUNTER — Other Ambulatory Visit (HOSPITAL_COMMUNITY): Payer: Self-pay

## 2019-04-21 NOTE — Discharge Instructions (Signed)

## 2019-04-22 ENCOUNTER — Other Ambulatory Visit: Payer: Self-pay

## 2019-04-22 ENCOUNTER — Encounter (HOSPITAL_COMMUNITY)
Admission: RE | Admit: 2019-04-22 | Discharge: 2019-04-22 | Disposition: A | Discharge status: Home / Self Care | Payer: Medicare HMO | Source: Ambulatory Visit | Attending: Nephrology | Admitting: Nephrology

## 2019-04-22 DIAGNOSIS — N189 Chronic kidney disease, unspecified: Secondary | ICD-10-CM | POA: Diagnosis present

## 2019-04-22 DIAGNOSIS — D631 Anemia in chronic kidney disease: Secondary | ICD-10-CM | POA: Insufficient documentation

## 2019-04-22 MED ORDER — SODIUM CHLORIDE 0.9 % IV SOLN
510.0000 mg | INTRAVENOUS | Status: DC
  Administered 2019-04-22: 510 mg via INTRAVENOUS
  Filled 2019-04-22: qty 17

## 2019-04-29 ENCOUNTER — Encounter (HOSPITAL_COMMUNITY)
Admission: RE | Admit: 2019-04-29 | Discharge: 2019-04-29 | Disposition: A | Discharge status: Home / Self Care | Payer: Medicare HMO | Source: Ambulatory Visit | Attending: Nephrology | Admitting: Nephrology

## 2019-04-29 ENCOUNTER — Other Ambulatory Visit: Payer: Self-pay

## 2019-04-29 DIAGNOSIS — D631 Anemia in chronic kidney disease: Secondary | ICD-10-CM | POA: Diagnosis not present

## 2019-04-29 DIAGNOSIS — N183 Chronic kidney disease, stage 3 unspecified: Secondary | ICD-10-CM | POA: Insufficient documentation

## 2019-04-29 MED ORDER — SODIUM CHLORIDE 0.9 % IV SOLN
510.0000 mg | INTRAVENOUS | Status: DC
  Administered 2019-04-29: 09:00:00 510 mg via INTRAVENOUS
  Filled 2019-04-29: qty 17

## 2019-04-30 ENCOUNTER — Ambulatory Visit: Payer: Self-pay | Admitting: Surgery

## 2019-05-07 ENCOUNTER — Other Ambulatory Visit (HOSPITAL_COMMUNITY): Payer: Medicare HMO

## 2019-05-08 ENCOUNTER — Telehealth: Payer: Self-pay | Admitting: Cardiology

## 2019-05-08 NOTE — Telephone Encounter (Signed)
   [facility] Pre-operative Risk Assessment    Request for surgical clearance:  1. What type of surgery is being performed? Parathyroidectomy with Neck Exploration    2. When is this surgery scheduled? 06/18/19  3. What type of clearance is required (medical clearance vs. Pharmacy clearance to hold med vs. Both)? Both   4. Are there any medications that need to be held prior to surgery and how long? Eliquis at discretion of Cardiology   5. Practice name and name of physician performing surgery? [facility], Dr. Armandina Gemma   6. What is your office phone number [phone]   7.   What is your office fax number [phone]  8.   Anesthesia type (None, local, MAC, general) ? General    Trilby Drummer 05/08/2019, 3:38 PM  _________________________________________________________________   (provider comments below)

## 2019-05-11 ENCOUNTER — Other Ambulatory Visit (HOSPITAL_COMMUNITY): Payer: Medicare HMO

## 2019-05-11 NOTE — Telephone Encounter (Signed)
Patient with diagnosis of afib on Eliquis for anticoagulation.    Procedure: Parathyroidectomy with Neck Exploration   Date of procedure: 06/18/2019  CHADS2-VASc score of  4 (HTN, AGE, DM2,AGE)  CrCl 46 ml/min  Per office protocol, patient can hold Eliquis for 2 days prior to procedure.

## 2019-05-11 NOTE — Telephone Encounter (Signed)
   Primary Cardiologist: Donato Heinz, MD  Chart reviewed as part of pre-operative protocol coverage.   Dr. Gardiner Rhyme you saw the patient on 04/06/19 and per note,  "LVH: Moderate basal septal hypertrophy on TTE.  Differential diagnosis includes hypertensive heart disease versus hypertrophic cardiomyopathy versus amyloidosis -Cardiac MRI for further evaluation" which is scheduled 06/10/19.   Surgery on 06/18/19. Please give your recommendations.   Pharmacy to review anticoagulation (for afib).   [place], [place] 05/11/2019, 10:13 AM

## 2019-05-12 NOTE — Telephone Encounter (Signed)
Will f/u results of CMR scheduled on 5/19.  Hayley, can we schedule appointment on 5/21 to review MRI results prior to his surgery?

## 2019-05-14 NOTE — Discharge Instructions (Signed)

## 2019-05-15 ENCOUNTER — Encounter (HOSPITAL_COMMUNITY): Payer: Self-pay

## 2019-05-15 ENCOUNTER — Inpatient Hospital Stay (HOSPITAL_COMMUNITY)
Admission: RE | Admit: 2019-05-15 | Discharge: 2019-05-15 | Disposition: A | Discharge status: Home / Self Care | Payer: Medicare HMO | Source: Ambulatory Visit | Attending: Nephrology | Admitting: Nephrology

## 2019-05-15 NOTE — Telephone Encounter (Signed)
Called and spoke to patient, scheduled for OV 5/21 to discuss MRI and surgical clearance.  Patient aware.

## 2019-06-08 MED ORDER — ATORVASTATIN CALCIUM 40 MG PO TABS
40.00 | ORAL_TABLET | ORAL | Status: DC
Start: 2019-06-08 — End: 2019-06-08

## 2019-06-08 MED ORDER — ONDANSETRON HCL 4 MG/2ML IJ SOLN
4.00 | INTRAMUSCULAR | Status: DC
Start: ? — End: 2019-06-08

## 2019-06-08 MED ORDER — SODIUM CHLORIDE FLUSH 0.9 % IV SOLN
10.00 | INTRAVENOUS | Status: DC
Start: ? — End: 2019-06-08

## 2019-06-08 MED ORDER — SORBITOL 70 % PO SOLN
30.00 | ORAL | Status: DC
Start: ? — End: 2019-06-08

## 2019-06-08 MED ORDER — FINASTERIDE 5 MG PO TABS
5.00 | ORAL_TABLET | ORAL | Status: DC
Start: 2019-06-09 — End: 2019-06-08

## 2019-06-08 MED ORDER — DILTIAZEM HCL ER BEADS 120 MG PO CP24
120.00 | ORAL_CAPSULE | ORAL | Status: DC
Start: 2019-06-08 — End: 2019-06-08

## 2019-06-08 MED ORDER — ACETAMINOPHEN 325 MG PO TABS
650.00 | ORAL_TABLET | ORAL | Status: DC
Start: ? — End: 2019-06-08

## 2019-06-08 MED ORDER — DEXTROMETHORPHAN-GUAIFENESIN 10-100 MG/5ML PO LIQD
5.00 | ORAL | Status: DC
Start: ? — End: 2019-06-08

## 2019-06-08 MED ORDER — MELATONIN 3 MG PO TABS
3.00 | ORAL_TABLET | ORAL | Status: DC
Start: ? — End: 2019-06-08

## 2019-06-08 MED ORDER — DSS 100 MG PO CAPS
100.00 | ORAL_CAPSULE | ORAL | Status: DC
Start: ? — End: 2019-06-08

## 2019-06-08 MED ORDER — PANTOPRAZOLE SODIUM 40 MG PO TBEC
40.00 | DELAYED_RELEASE_TABLET | ORAL | Status: DC
Start: 2019-06-09 — End: 2019-06-08

## 2019-06-08 MED ORDER — HYDROXYZINE HCL 25 MG PO TABS
25.00 | ORAL_TABLET | ORAL | Status: DC
Start: ? — End: 2019-06-08

## 2019-06-08 MED ORDER — APIXABAN 5 MG PO TABS
5.00 | ORAL_TABLET | ORAL | Status: DC
Start: 2019-06-08 — End: 2019-06-08

## 2019-06-08 MED ORDER — ALLOPURINOL 100 MG PO TABS
100.00 | ORAL_TABLET | ORAL | Status: DC
Start: 2019-06-08 — End: 2019-06-08

## 2019-06-08 MED ORDER — BISACODYL 5 MG PO TBEC
10.00 | DELAYED_RELEASE_TABLET | ORAL | Status: DC
Start: ? — End: 2019-06-08

## 2019-06-08 MED ORDER — SODIUM CHLORIDE FLUSH 0.9 % IV SOLN
10.00 | INTRAVENOUS | Status: DC
Start: 2019-06-08 — End: 2019-06-08

## 2019-06-08 NOTE — Patient Instructions (Addendum)
DUE TO COVID-19 ONLY ONE VISITOR IS ALLOWED TO COME WITH YOU AND STAY IN THE WAITING ROOM ONLY DURING PRE OP AND PROCEDURE DAY OF SURGERY. THE 1 VISITOR MAY VISIT WITH YOU AFTER SURGERY IN YOUR PRIVATE ROOM DURING VISITING HOURS ONLY!  YOU NEED TO HAVE A COVID 19 TEST ON: 06/15/19 @  11:35 am , THIS TEST MUST BE DONE BEFORE SURGERY, COME  Martin Richards, [place] , [address].  ([place]) ONCE YOUR COVID TEST IS COMPLETED, PLEASE BEGIN THE QUARANTINE INSTRUCTIONS AS OUTLINED IN YOUR HANDOUT.                Martin Richards   Your procedure is scheduled on: 06/18/19   Report to [facility] Main  Entrance   Report to short stay at: 5:30 AM     Call this number if you have problems the morning of surgery [phone]    Remember: Do not eat food or drink liquids :After Midnight.   BRUSH YOUR TEETH MORNING OF SURGERY AND RINSE YOUR MOUTH OUT, NO CHEWING GUM CANDY OR MINTS.     Take these medicines the morning of surgery with A SIP OF WATER: allopurinol,diltiazem,finasteride(proscar),pantoprazole,colchicine as needed.                                 You may not have any metal on your body including hair pins and              piercings  Do not wear jewelry, lotions, powders or perfumes, deodorant             Men may shave face and neck.   Do not bring valuables to the hospital. [place].  Contacts, dentures or bridgework may not be worn into surgery.  Leave suitcase in the car. After surgery it may be brought to your room.     Patients discharged the day of surgery will not be allowed to drive home. IF YOU ARE HAVING SURGERY AND GOING HOME THE SAME DAY, YOU MUST HAVE AN ADULT TO DRIVE YOU HOME AND BE WITH YOU FOR 24 HOURS. YOU MAY GO HOME BY TAXI OR UBER OR ORTHERWISE, BUT AN ADULT MUST ACCOMPANY YOU HOME AND STAY WITH YOU FOR 24 HOURS.  Name and phone number of your driver:  Special Instructions: N/A           Please read over the following fact sheets you were given: _____________________________________________________________________   [facility] - Preparing for Surgery Before surgery, you can play an important role.  Because skin is not sterile, your skin needs to be as free of germs as possible.  You can reduce the number of germs on your skin by washing with CHG (chlorahexidine gluconate) soap before surgery.  CHG is an antiseptic cleaner which kills germs and bonds with the skin to continue killing germs even after washing. Please DO NOT use if you have an allergy to CHG or antibacterial soaps.  If your skin becomes reddened/irritated stop using the CHG and inform your nurse when you arrive at Short Stay. Do not shave (including legs and underarms) for at least 48 hours prior to the first CHG shower.  You may shave your face/neck. Please follow these instructions carefully:  1.  Shower with CHG Soap the night before  surgery and the  morning of Surgery.  2.  If you choose to wash your hair, wash your hair first as usual with your  normal  shampoo.  3.  After you shampoo, rinse your hair and body thoroughly to remove the  shampoo.                           4.  Use CHG as you would any other liquid soap.  You can apply chg directly  to the skin and wash                       Gently with a scrungie or clean washcloth.  5.  Apply the CHG Soap to your body ONLY FROM THE NECK DOWN.   Do not use on face/ open                           Wound or open sores. Avoid contact with eyes, ears mouth and genitals (private parts).                       Wash face,  Genitals (private parts) with your normal soap.             6.  Wash thoroughly, paying special attention to the area where your surgery  will be performed.  7.  Thoroughly rinse your body with warm water from the neck down.  8.  DO NOT shower/wash with your normal soap after using and rinsing off  the CHG Soap.                9.  Pat yourself  dry with a clean towel.            10.  Wear clean pajamas.            11.  Place clean sheets on your bed the night of your first shower and do not  sleep with pets. Day of Surgery : Do not apply any lotions/deodorants the morning of surgery.  Please wear clean clothes to the hospital/surgery center.  FAILURE TO FOLLOW THESE INSTRUCTIONS MAY RESULT IN THE CANCELLATION OF YOUR SURGERY PATIENT SIGNATURE_________________________________  NURSE SIGNATURE__________________________________  ________________________________________________________________________

## 2019-06-09 ENCOUNTER — Other Ambulatory Visit: Payer: Self-pay

## 2019-06-09 ENCOUNTER — Telehealth (HOSPITAL_COMMUNITY): Payer: Self-pay | Admitting: *Deleted

## 2019-06-09 ENCOUNTER — Encounter (HOSPITAL_COMMUNITY)
Admission: RE | Admit: 2019-06-09 | Discharge: 2019-06-09 | Disposition: A | Discharge status: Home / Self Care | Payer: Medicare HMO | Source: Ambulatory Visit | Attending: Surgery | Admitting: Surgery

## 2019-06-09 ENCOUNTER — Encounter (HOSPITAL_COMMUNITY): Payer: Self-pay

## 2019-06-09 DIAGNOSIS — Z01812 Encounter for preprocedural laboratory examination: Secondary | ICD-10-CM | POA: Diagnosis not present

## 2019-06-09 HISTORY — DX: Cardiac arrhythmia, unspecified: I49.9

## 2019-06-09 LAB — CBC
HCT: 39.6 % (ref 39.0–52.0)
Hemoglobin: 12.1 g/dL — ABNORMAL LOW (ref 13.0–17.0)
MCH: 23.8 pg — ABNORMAL LOW (ref 26.0–34.0)
MCHC: 30.6 g/dL (ref 30.0–36.0)
MCV: 78 fL — ABNORMAL LOW (ref 80.0–100.0)
Platelets: 231 10*3/uL (ref 150–400)
RBC: 5.08 MIL/uL (ref 4.22–5.81)
RDW: 17.2 % — ABNORMAL HIGH (ref 11.5–15.5)
WBC: 9.4 10*3/uL (ref 4.0–10.5)
nRBC: 0 % (ref 0.0–0.2)

## 2019-06-09 LAB — BASIC METABOLIC PANEL
Anion gap: 10 (ref 5–15)
BUN: 17 mg/dL (ref 8–23)
CO2: 27 mmol/L (ref 22–32)
Calcium: 11.3 mg/dL — ABNORMAL HIGH (ref 8.9–10.3)
Chloride: 106 mmol/L (ref 98–111)
Creatinine, Ser: 1.44 mg/dL — ABNORMAL HIGH (ref 0.61–1.24)
GFR calc Af Amer: 54 mL/min — ABNORMAL LOW (ref >60)
GFR calc non Af Amer: 47 mL/min — ABNORMAL LOW (ref >60)
Glucose, Bld: 142 mg/dL — ABNORMAL HIGH (ref 70–99)
Potassium: 3.9 mmol/L (ref 3.5–5.1)
Sodium: 143 mmol/L (ref 135–145)

## 2019-06-09 LAB — GLUCOSE, CAPILLARY: Glucose-Capillary: 128 mg/dL — ABNORMAL HIGH (ref 70–99)

## 2019-06-09 MED ORDER — ALLOPURINOL 100 MG PO TABS
200.00 | ORAL_TABLET | ORAL | Status: DC
Start: 2019-06-09 — End: 2019-06-09

## 2019-06-09 MED ORDER — FUROSEMIDE 40 MG PO TABS
40.00 | ORAL_TABLET | ORAL | Status: DC
Start: 2019-06-08 — End: 2019-06-09

## 2019-06-09 MED ORDER — ATORVASTATIN CALCIUM 40 MG PO TABS
80.00 | ORAL_TABLET | ORAL | Status: DC
Start: 2019-06-08 — End: 2019-06-09

## 2019-06-09 NOTE — Telephone Encounter (Signed)
Reaching out to patient to offer assistance regarding upcoming cardiac imaging study; pt verbalizes understanding of appt date/time, parking situation and where to check in, number provided for further questions should they arise  Burley Saver RN [facility] [phone] office [phone] cell

## 2019-06-09 NOTE — Progress Notes (Addendum)
PCP - Dr. Willey Blade. LOV: 04/06/19 Cardiologist - Dr. Oswaldo Milian. LOV: 04/06/19  Chest x-ray - 03/03/19. EPIC EKG - 04/06/19. EPIC Stress Test -  ECHO - 03/04/19.: TEE. EPIC Cardiac Cath -   Sleep Study -  CPAP -   Fasting Blood Sugar -  Checks Blood Sugar _____ times a day  Blood Thinner Instructions:Eliquis was stopped by pt. on 06/08/19.RN advised pt. To call MD. And request specific instructions about ELiquis. Aspirin Instructions: Last Dose:  Anesthesia review: Hx.: HTN,DIA,Afib  Patient denies shortness of breath, fever, cough and chest pain at PAT appointment   Patient verbalized understanding of instructions that were given to them at the PAT appointment. Patient was also instructed that they will need to review over the PAT instructions again at home before surgery.

## 2019-06-10 ENCOUNTER — Ambulatory Visit (HOSPITAL_COMMUNITY)
Admission: RE | Admit: 2019-06-10 | Discharge: 2019-06-10 | Disposition: A | Discharge status: Home / Self Care | Payer: Medicare HMO | Source: Ambulatory Visit | Attending: Cardiology | Admitting: Cardiology

## 2019-06-10 DIAGNOSIS — I517 Cardiomegaly: Secondary | ICD-10-CM | POA: Diagnosis present

## 2019-06-10 LAB — HEMOGLOBIN A1C
Hgb A1c MFr Bld: 5.7 % — ABNORMAL HIGH (ref 4.8–5.6)
Mean Plasma Glucose: 117 mg/dL

## 2019-06-10 MED ORDER — GADOBUTROL 1 MMOL/ML IV SOLN
11.0000 mL | Freq: Once | INTRAVENOUS | Status: AC | PRN
  Administered 2019-06-10: 11 mL via INTRAVENOUS

## 2019-06-11 ENCOUNTER — Encounter (HOSPITAL_COMMUNITY): Admission: RE | Admit: 2019-06-11 | Payer: Medicare HMO | Source: Ambulatory Visit

## 2019-06-12 ENCOUNTER — Encounter (HOSPITAL_COMMUNITY): Payer: Medicare HMO

## 2019-06-12 ENCOUNTER — Ambulatory Visit: Payer: Medicare HMO | Admitting: Cardiology

## 2019-06-15 ENCOUNTER — Other Ambulatory Visit (HOSPITAL_COMMUNITY)
Admission: RE | Admit: 2019-06-15 | Discharge: 2019-06-15 | Disposition: A | Discharge status: Home / Self Care | Payer: Medicare HMO | Source: Ambulatory Visit | Attending: Surgery | Admitting: Surgery

## 2019-06-15 DIAGNOSIS — Z01812 Encounter for preprocedural laboratory examination: Secondary | ICD-10-CM | POA: Insufficient documentation

## 2019-06-15 DIAGNOSIS — Z20822 Contact with and (suspected) exposure to covid-19: Secondary | ICD-10-CM | POA: Insufficient documentation

## 2019-06-15 LAB — SARS CORONAVIRUS 2 (TAT 6-24 HRS): SARS Coronavirus 2: NEGATIVE

## 2019-06-15 NOTE — Telephone Encounter (Signed)
Claiborne Billings from [facility] is calling to follow up. Patient rescheduled his 05/21 appt. She needs to know how long he needs to hold eliquis.

## 2019-06-16 ENCOUNTER — Encounter (HOSPITAL_COMMUNITY): Payer: Self-pay | Admitting: Surgery

## 2019-06-16 DIAGNOSIS — E210 Primary hyperparathyroidism: Secondary | ICD-10-CM | POA: Diagnosis present

## 2019-06-16 NOTE — Telephone Encounter (Signed)
Informed Claiborne Billings at [facility], Dr. Rayann Heman office notified that I have tried multiple time to tell pt not to take Eliquis. She states that I should try pts wife's number.  S/w pt's wife she states that pt has not taken Eliquis since Sunday. She states that she would suggest to have her number to be pt's primary contact. Informed wife that at their next appt she will fill this out so we can speak with her.

## 2019-06-16 NOTE — H&P (Signed)
General Surgery [facility]  Martin Richards DOB: 1940-09-27 Married / Language: English / Race: Black or African American Male   History of Present Illness  The patient is a 79 year old male who presents with primary hyperparathyroidism.  CHIEF COMPLAINT: primary hyperparathyroidism  Patient is referred by Dr. Shelly Coss for surgical evaluation and management of primary hyperparathyroidism. Patient was admitted to the hospital in January with multiple medical problems. He has chronic kidney disease, stage IIIB, hypertension, and anemia. He had an episode of atrial fibrillation during this hospitalization. He underwent full cardiac evaluation. Patient also has type 2 diabetes. During his evaluation he was noted to have significant hypercalcemia with calcium levels ranging from 11.7-12.5. Subsequent parathyroid hormone level was found to be elevated at 175. Patient underwent nuclear medicine parathyroid scan on March 06, 2019. This localized a left inferior parathyroid adenoma. Patient is referred at this time for surgical assessment and recommendations. Patient has noted fatigue. He notes bone and joint discomfort. He notes muscle weakness. He has never had nephrolithiasis. He has never had a bone density scan. Patient is currently taking Sensipar since discharge from the hospital. Patient has had no prior head or neck surgery. There is no family history of parathyroid disease or other endocrine neoplasm. He presents today accompanied by his family. We reviewed his hospital records, his imaging studies, and his laboratory studies together today. I provided them with copies.   Past Surgical History  Colon Polyp Removal - Colonoscopy  Gallbladder Surgery - Laparoscopic   Diagnostic Studies History  Colonoscopy  within last year  Allergies No Known Allergies   Medication History Allopurinol (100MG  Tablet, Oral) Active. Atorvastatin  Calcium (20MG  Tablet, Oral) Active. Cinacalcet HCl (30MG  Tablet, Oral) Active. Colcrys (0.6MG  Tablet, Oral) Active. dilTIAZem HCl ER Coated Beads (120MG  Capsule ER 24HR, Oral) Active. Eliquis (5MG  Tablet, Oral) Active. Finasteride (5MG  Tablet, Oral) Active. Furosemide (40MG  Tablet, Oral) Active. hydroCHLOROthiazide (25MG  Tablet, Oral) Active. Magnesium Oxide (400 (241.3 Mg)MG Tablet, Oral) Active. Pantoprazole Sodium (40MG  Tablet DR, Oral) Active. Phospha 250 Neutral (155-852-130MG  Tablet, Oral) Active. Sulfamethoxazole-Trimethoprim (800-160MG  Tablet, Oral) Active. Valsartan-hydroCHLOROthiazide (320-25MG  Tablet, Oral) Active. Victoza (18MG /3ML Soln Pen-inj, Subcutaneous) Active. Medications Reconciled  Social History Alcohol use  Occasional alcohol use. Caffeine use  Coffee, Tea. No drug use  Tobacco use  Never smoker.  Family History Diabetes Mellitus  Brother, Mother, Son. Hypertension  Brother, Mother. Prostate Cancer  Father. Respiratory Condition  Sister.  Other Problems Diabetes Mellitus  High blood pressure  Thyroid Disease   Review of Systems General Not Present- Appetite Loss, Chills, Fatigue, Fever, Night Sweats, Weight Gain and Weight Loss. Skin Not Present- Change in Wart/Mole, Dryness, Hives, Jaundice, New Lesions, Non-Healing Wounds, Rash and Ulcer. HEENT Present- Hearing Loss. Not Present- Earache, Hoarseness, Nose Bleed, Oral Ulcers, Ringing in the Ears, Seasonal Allergies, Sinus Pain, Sore Throat, Visual Disturbances, Wears glasses/contact lenses and Yellow Eyes. Respiratory Present- Snoring. Not Present- Bloody sputum, Chronic Cough, Difficulty Breathing and Wheezing. Breast Not Present- Breast Mass, Breast Pain, Nipple Discharge and Skin Changes. Cardiovascular Not Present- Chest Pain, Difficulty Breathing Lying Down, Leg Cramps, Palpitations, Rapid Heart Rate, Shortness of Breath and Swelling of Extremities. Gastrointestinal Not  Present- Abdominal Pain, Bloating, Bloody Stool, Change in Bowel Habits, Chronic diarrhea, Constipation, Difficulty Swallowing, Excessive gas, Gets full quickly at meals, Hemorrhoids, Indigestion, Nausea, Rectal Pain and Vomiting. Male Genitourinary Not Present- Blood in Urine, Change in Urinary Stream, Frequency, Impotence, Nocturia, Painful Urination, Urgency and Urine Leakage.  Vitals  Weight: 216.13 lb Height: 74.5in Body Surface Area: 2.26 m Body Mass Index: 27.38 kg/m  Temp.: 98.31F  Pulse: 89 (Regular)  BP: 132/74(Sitting, Left Arm, Standard)  Physical Exam  GENERAL APPEARANCE Development: normal Nutritional status: normal Gross deformities: seated in a wheelchair  SKIN Rash, lesions, ulcers: none Induration, erythema: none Nodules: none palpable  EYES Conjunctiva and lids: normal Pupils: equal and reactive Iris: normal bilaterally  EARS, NOSE, MOUTH, THROAT External ears: no lesion or deformity External nose: no lesion or deformity Hearing: grossly normal Due to Covid-19 pandemic, patient is wearing a mask.  NECK Symmetric: yes Trachea: midline Thyroid: no palpable nodules in the thyroid bed  CHEST Respiratory effort: normal Retraction or accessory muscle use: no Breath sounds: normal bilaterally Rales, rhonchi, wheeze: none  CARDIOVASCULAR Auscultation: regular rhythm, normal rate Murmurs: none Pulses: radial pulse 2+ palpable Lower extremity edema: moderate bilateral  MUSCULOSKELETAL Station and gait: normal Digits and nails: no clubbing or cyanosis Muscle strength: grossly normal all extremities Range of motion: grossly normal all extremities Deformity: none  LYMPHATIC Cervical: none palpable Supraclavicular: none palpable  PSYCHIATRIC Oriented to person, place, and time: yes Mood and affect: normal for situation Judgment and insight: appropriate for situation    Assessment & Plan  PRIMARY HYPERPARATHYROIDISM  (E21.0)  Patient is referred for evaluation of primary hyperparathyroidism. He is accompanied by his family. Patient provided with a copy of "Parathyroid Surgery: Treatment for Your Parathyroid Gland Problem", published by [employer], 12 pages. Book reviewed and explained to patient during visit today.  Patient has biochemical evidence of primary hyperparathyroidism. Imaging studies have localized a left inferior parathyroid adenoma. We discussed parathyroid disease in detail and I reviewed the above publications with them in detail. We discussed the rationale for minimally invasive parathyroidectomy versus 4 gland neck exploration. We discussed doing the minimally invasive surgery as an outpatient surgical procedure. We discussed risk and benefits of the surgery including the risk of recurrent laryngeal nerve injury and the risk of having additional glands involved with the disease. The patient understands and wishes to proceed with surgery in the near future. We discussed the location of the surgical incision. We discussed the postoperative course to be anticipated.  The risks and benefits of the procedure have been discussed at length with the patient. The patient understands the proposed procedure, potential alternative treatments, and the course of recovery to be expected. All of the patient's questions have been answered at this time. The patient wishes to proceed with surgery.  Armandina Gemma, MD [facility] Office: [phone]

## 2019-06-16 NOTE — Progress Notes (Signed)
Anesthesia Chart Review   Case: [id number] Date/Time: 06/18/19 0715   Procedure: NECK EXPLORATION AND PARATHYROIDECTOMY, POSSIBLE MULTIPLE GLANDS (N/A )   Anesthesia type: General   Pre-op diagnosis: PRIMARY HYPERPARATHYROIDISM   Location: [facility] ROOM 01 / [facility]   Surgeons: Armandina Gemma, MD      DISCUSSION:79 y.o. never smoker with h/o HTN, a-fib, DM II, primary hyperparathyroidism scheduled for above procedure 06/18/2019 with Dr. Armandina Gemma.   Per cardiology note 06/16/2019, "Chart reviewed as part of pre-operative protocol coverage. Given past medical history and time since last visit, based on ACC/AHA guidelines, Martin Richards would be at acceptable risk for the planned procedure without further cardiovascular testing.  Patient with diagnosis of afib on Eliquis for anticoagulation.  Procedure: Parathyroidectomy with Neck Exploration  Date of procedure: 06/18/2019  CHADS2-VASc score of 4 (HTN, AGE, DM2,AGE)  CrCl 46 ml/min  Per office protocol, patient can hold Eliquis for 2 days prior to procedure."  Last A1C 5.7 on 06/09/19.    Anticipate pt can proceed with planned procedure barring acute status change.   VS: BP (!) 158/79 (BP Location: Right Arm)   Pulse 71   Temp 36.8 C (Oral)   Resp 18   Ht 6\' 2"  (1.88 m)   Wt 104.3 kg   SpO2 100%   BMI 29.53 kg/m   PROVIDERS: Willey Blade, MD is PCP   Cardiologist - Dr. Oswaldo Milian LABS: Labs reviewed: Acceptable for surgery. (all labs ordered are listed, but only abnormal results are displayed)  Labs Reviewed  GLUCOSE, CAPILLARY - Abnormal; Notable for the following components:      Result Value   Glucose-Capillary 128 (*)    All other components within normal limits     IMAGES: MR Cardiac 06/10/2019 IMPRESSION: 1.  Normal LV size with mild focal basal septal hypertrophy, EF 58%. 2.  Normal RV size with EF 58%. 3. No myocardial LGE, so no definitive evidence for prior MI, myocarditis, or infiltrative  disease. 4.  Normal ECV No evidence for significant pathology, possible "senile septum."  EKG: 04/06/2019 Rate 68 bpm Normal sinus rhythm   CV: Echo 03/04/2019 IMPRESSIONS   1. Left ventricular ejection fraction, by estimation, is 65 to 70%. The  left ventricle has normal function. The left ventrical has no regional  wall motion abnormalities. There is moderately increased left ventricular  hypertrophy of the basal-septal  segment. Left ventricular diastolic function could not be evaluated  secondary to atrial fibrillation.  2. Right ventricular systolic function is normal. The right ventricular  size is normal. Tricuspid regurgitation signal is inadequate for assessing  PA pressure.  3. The mitral valve is normal in structure and function. trivial mitral  valve regurgitation. No evidence of mitral stenosis.  4. The aortic valve is tricuspid. Aortic valve regurgitation is not  visualized. Mild aortic valve sclerosis is present, with no evidence of  aortic valve stenosis.  5. The inferior vena cava is dilated in size with <50% respiratory  variability, suggesting right atrial pressure of 15 mmHg.  Past Medical History:  Diagnosis Date  . Diabetes mellitus   . Dysrhythmia    Hx. AFIB  . Gout   . Hypertension     Past Surgical History:  Procedure Laterality Date  . BIOPSY  03/06/2019   Procedure: BIOPSY;  Surgeon: Carol Ada, MD;  Location: [place];  Service: Endoscopy;;  . CHOLECYSTECTOMY    . COLONOSCOPY WITH PROPOFOL N/A 03/06/2019   Procedure: COLONOSCOPY WITH PROPOFOL;  Surgeon: Benson Norway,  Saralyn Pilar, MD;  Location: [place];  Service: Endoscopy;  Laterality: N/A;  . ESOPHAGOGASTRODUODENOSCOPY (EGD) WITH PROPOFOL N/A 03/06/2019   Procedure: ESOPHAGOGASTRODUODENOSCOPY (EGD) WITH PROPOFOL;  Surgeon: Carol Ada, MD;  Location: [place];  Service: Endoscopy;  Laterality: N/A;  . HEMOSTASIS CLIP PLACEMENT  03/06/2019   Procedure: HEMOSTASIS CLIP PLACEMENT;   Surgeon: Carol Ada, MD;  Location: [facility] ENDOSCOPY;  Service: Endoscopy;;  . POLYPECTOMY  03/06/2019   Procedure: POLYPECTOMY;  Surgeon: Carol Ada, MD;  Location: [facility] ENDOSCOPY;  Service: Endoscopy;;  . SUBMUCOSAL LIFTING INJECTION  03/06/2019   Procedure: SUBMUCOSAL LIFTING INJECTION;  Surgeon: Carol Ada, MD;  Location: [place];  Service: Endoscopy;;    MEDICATIONS: . allopurinol (ZYLOPRIM) 100 MG tablet  . apixaban (ELIQUIS) 5 MG TABS tablet  . cholecalciferol (VITAMIN D) 25 MCG tablet  . colchicine 0.6 MG tablet  . diltiazem (CARDIZEM CD) 120 MG 24 hr capsule  . FeFum-FePoly-FA-B Cmp-C-Biot (INTEGRA PLUS) CAPS  . finasteride (PROSCAR) 5 MG tablet  . furosemide (LASIX) 40 MG tablet  . magnesium oxide (MAG-OX) 400 (241.3 Mg) MG tablet  . Multiple Vitamin (MULITIVITAMIN WITH MINERALS) TABS  . pantoprazole (PROTONIX) 40 MG tablet  . phosphorus (K PHOS NEUTRAL) 155-852-130 MG tablet  . potassium chloride SA (KLOR-CON) 20 MEQ tablet   No current facility-administered medications for this encounter.    Maia Plan [facility] Pre-Surgical Testing [phone] 06/16/19  3:02 PM

## 2019-06-16 NOTE — Telephone Encounter (Signed)
   Primary Cardiologist: Donato Heinz, MD  Chart reviewed as part of pre-operative protocol coverage. Given past medical history and time since last visit, based on ACC/AHA guidelines, Martin Richards would be at acceptable risk for the planned procedure without further cardiovascular testing.   Patient with diagnosis of afib on Eliquis for anticoagulation.   Procedure: Parathyroidectomy with Neck Exploration  Date of procedure: 06/18/2019  CHADS2-VASc score of 4 (HTN, AGE, DM2,AGE)  CrCl 46 ml/min   Per office protocol, patient can hold Eliquis for 2 days prior to procedure.   I will route this recommendation to the requesting party via Epic fax function and remove from pre-op pool.  Please call with questions.  Martin Ng. [name] NP-C    06/16/2019, 7:50 AM [place] Office [phone] Fax [phone]

## 2019-06-17 ENCOUNTER — Encounter (HOSPITAL_COMMUNITY): Payer: Self-pay | Admitting: Surgery

## 2019-06-17 NOTE — Anesthesia Preprocedure Evaluation (Addendum)
Anesthesia Evaluation  Patient identified by MRN, date of birth, ID band Patient awake    Reviewed: Allergy & Precautions, NPO status , Patient's Chart, lab work & pertinent test results  Airway Mallampati: II  TM Distance: >3 FB Neck ROM: Full    Dental  (+) Upper Dentures, Caps,    Pulmonary    Pulmonary exam normal breath sounds clear to auscultation       Cardiovascular hypertension, Normal cardiovascular exam+ dysrhythmias Atrial Fibrillation  Rhythm:Regular Rate:Normal  03/04/19 Echo  1. Left ventricular ejection fraction, by estimation, is 65 to 70%. The left ventricle has normal function. The left ventrical has no regional wall motion abnormalities. There is moderately increased left ventricular hypertrophy of the basal-septal segment. Left ventricular diastolic function could not be evaluated secondary to atrial fibrillation.  2. Right ventricular systolic function is normal. The right ventricular size is normal. Tricuspid regurgitation signal is inadequate for assessing PA pressure.  3. The mitral valve is normal in structure and function. trivial mitral  valve regurgitation. No evidence of mitral stenosis.  4. The aortic valve is tricuspid. Aortic valve regurgitation is not visualized. Mild aortic valve sclerosis is present, with no evidence of aortic valve stenosis.  5. The inferior vena cava is dilated in size with <50% respiratory variability, suggesting right atrial pressure of 15 mmHg.   EKG 04/06/2019 NSR   Neuro/Psych  Neuromuscular disease negative psych ROS   GI/Hepatic negative GI ROS, Neg liver ROS,   Endo/Other  diabetes, Well Controlled, Type 2Primary Hyperparathyroidism Gout  Renal/GU Renal InsufficiencyRenal disease  negative genitourinary   Musculoskeletal negative musculoskeletal ROS (+)   Abdominal   Peds  Hematology negative hematology ROS (+) Eliquis therapy- last dose 06/14/19    Anesthesia Other Findings   Reproductive/Obstetrics                           Anesthesia Physical Anesthesia Plan  ASA: II  Anesthesia Plan: General   Post-op Pain Management:    Induction: Intravenous  PONV Risk Score and Plan: 2 and Ondansetron, Treatment may vary due to age or medical condition and Dexamethasone  Airway Management Planned: Oral ETT  Additional Equipment:   Intra-op Plan:   Post-operative Plan: Extubation in OR  Informed Consent: I have reviewed the patients History and Physical, chart, labs and discussed the procedure including the risks, benefits and alternatives for the proposed anesthesia with the patient or authorized representative who has indicated his/her understanding and acceptance.     Dental advisory given  Plan Discussed with: CRNA and Surgeon  Anesthesia Plan Comments:         Anesthesia Quick Evaluation

## 2019-06-18 ENCOUNTER — Ambulatory Visit (HOSPITAL_COMMUNITY): Payer: Medicare HMO | Admitting: Physician Assistant

## 2019-06-18 ENCOUNTER — Encounter (HOSPITAL_COMMUNITY): Payer: Self-pay | Admitting: Surgery

## 2019-06-18 ENCOUNTER — Ambulatory Visit (HOSPITAL_COMMUNITY)
Admission: RE | Admit: 2019-06-18 | Discharge: 2019-06-18 | Disposition: A | Discharge status: Home / Self Care | Payer: Medicare HMO | Source: Other Acute Inpatient Hospital | Attending: Surgery | Admitting: Surgery

## 2019-06-18 ENCOUNTER — Encounter (HOSPITAL_COMMUNITY)
Admission: RE | Disposition: A | Discharge status: Home / Self Care | Payer: Self-pay | Source: Other Acute Inpatient Hospital | Attending: Surgery

## 2019-06-18 ENCOUNTER — Ambulatory Visit (HOSPITAL_COMMUNITY): Payer: Medicare HMO | Admitting: Anesthesiology

## 2019-06-18 DIAGNOSIS — E1122 Type 2 diabetes mellitus with diabetic chronic kidney disease: Secondary | ICD-10-CM | POA: Diagnosis not present

## 2019-06-18 DIAGNOSIS — Z833 Family history of diabetes mellitus: Secondary | ICD-10-CM | POA: Diagnosis not present

## 2019-06-18 DIAGNOSIS — Z79899 Other long term (current) drug therapy: Secondary | ICD-10-CM | POA: Diagnosis not present

## 2019-06-18 DIAGNOSIS — D631 Anemia in chronic kidney disease: Secondary | ICD-10-CM | POA: Insufficient documentation

## 2019-06-18 DIAGNOSIS — E210 Primary hyperparathyroidism: Secondary | ICD-10-CM | POA: Diagnosis not present

## 2019-06-18 DIAGNOSIS — Z836 Family history of other diseases of the respiratory system: Secondary | ICD-10-CM | POA: Diagnosis not present

## 2019-06-18 DIAGNOSIS — Z86010 Personal history of colonic polyps: Secondary | ICD-10-CM | POA: Insufficient documentation

## 2019-06-18 DIAGNOSIS — Z8042 Family history of malignant neoplasm of prostate: Secondary | ICD-10-CM | POA: Diagnosis not present

## 2019-06-18 DIAGNOSIS — D351 Benign neoplasm of parathyroid gland: Secondary | ICD-10-CM | POA: Insufficient documentation

## 2019-06-18 DIAGNOSIS — G709 Myoneural disorder, unspecified: Secondary | ICD-10-CM | POA: Insufficient documentation

## 2019-06-18 DIAGNOSIS — I10 Essential (primary) hypertension: Secondary | ICD-10-CM | POA: Diagnosis not present

## 2019-06-18 DIAGNOSIS — Z7901 Long term (current) use of anticoagulants: Secondary | ICD-10-CM | POA: Diagnosis not present

## 2019-06-18 DIAGNOSIS — M109 Gout, unspecified: Secondary | ICD-10-CM | POA: Insufficient documentation

## 2019-06-18 DIAGNOSIS — I358 Other nonrheumatic aortic valve disorders: Secondary | ICD-10-CM | POA: Insufficient documentation

## 2019-06-18 DIAGNOSIS — I4891 Unspecified atrial fibrillation: Secondary | ICD-10-CM | POA: Insufficient documentation

## 2019-06-18 DIAGNOSIS — N1832 Chronic kidney disease, stage 3b: Secondary | ICD-10-CM | POA: Insufficient documentation

## 2019-06-18 DIAGNOSIS — I129 Hypertensive chronic kidney disease with stage 1 through stage 4 chronic kidney disease, or unspecified chronic kidney disease: Secondary | ICD-10-CM | POA: Diagnosis not present

## 2019-06-18 DIAGNOSIS — Z8249 Family history of ischemic heart disease and other diseases of the circulatory system: Secondary | ICD-10-CM | POA: Diagnosis not present

## 2019-06-18 HISTORY — PX: PARATHYROIDECTOMY: SHX19

## 2019-06-18 LAB — GLUCOSE, CAPILLARY
Glucose-Capillary: 119 mg/dL — ABNORMAL HIGH (ref 70–99)
Glucose-Capillary: 146 mg/dL — ABNORMAL HIGH (ref 70–99)

## 2019-06-18 SURGERY — PARATHYROIDECTOMY
Anesthesia: General

## 2019-06-18 MED ORDER — CHLORHEXIDINE GLUCONATE CLOTH 2 % EX PADS: 6.0000 | MEDICATED_PAD | Freq: Once | CUTANEOUS | Status: DC

## 2019-06-18 MED ORDER — DEXAMETHASONE SODIUM PHOSPHATE 10 MG/ML IJ SOLN
INTRAMUSCULAR | Status: DC | PRN
  Administered 2019-06-18: 10 mg via INTRAVENOUS

## 2019-06-18 MED ORDER — ROCURONIUM BROMIDE 10 MG/ML (PF) SYRINGE
PREFILLED_SYRINGE | INTRAVENOUS | Status: AC
  Filled 2019-06-18: qty 10

## 2019-06-18 MED ORDER — OXYCODONE HCL 5 MG/5ML PO SOLN: 5.0000 mg | Freq: Once | ORAL | Status: AC | PRN

## 2019-06-18 MED ORDER — LACTATED RINGERS IV SOLN: INTRAVENOUS | Status: DC

## 2019-06-18 MED ORDER — PROPOFOL 10 MG/ML IV BOLUS
INTRAVENOUS | Status: AC
  Filled 2019-06-18: qty 20

## 2019-06-18 MED ORDER — DEXAMETHASONE SODIUM PHOSPHATE 10 MG/ML IJ SOLN
INTRAMUSCULAR | Status: AC
  Filled 2019-06-18: qty 1

## 2019-06-18 MED ORDER — PHENYLEPHRINE HCL-NACL 10-0.9 MG/250ML-% IV SOLN
INTRAVENOUS | Status: DC | PRN
  Administered 2019-06-18: 100 ug/min via INTRAVENOUS

## 2019-06-18 MED ORDER — EPHEDRINE SULFATE-NACL 50-0.9 MG/10ML-% IV SOSY
PREFILLED_SYRINGE | INTRAVENOUS | Status: DC | PRN
  Administered 2019-06-18: 20 mg via INTRAVENOUS

## 2019-06-18 MED ORDER — EPHEDRINE 5 MG/ML INJ
INTRAVENOUS | Status: AC
  Filled 2019-06-18: qty 20

## 2019-06-18 MED ORDER — PHENYLEPHRINE 40 MCG/ML (10ML) SYRINGE FOR IV PUSH (FOR BLOOD PRESSURE SUPPORT)
PREFILLED_SYRINGE | INTRAVENOUS | Status: AC
  Filled 2019-06-18: qty 20

## 2019-06-18 MED ORDER — ROCURONIUM BROMIDE 10 MG/ML (PF) SYRINGE
PREFILLED_SYRINGE | INTRAVENOUS | Status: DC | PRN
  Administered 2019-06-18: 20 mg via INTRAVENOUS
  Administered 2019-06-18: 60 mg via INTRAVENOUS

## 2019-06-18 MED ORDER — HYDRALAZINE HCL 20 MG/ML IJ SOLN
INTRAMUSCULAR | Status: AC
  Filled 2019-06-18: qty 1

## 2019-06-18 MED ORDER — FENTANYL CITRATE (PF) 100 MCG/2ML IJ SOLN
INTRAMUSCULAR | Status: AC
  Filled 2019-06-18: qty 2

## 2019-06-18 MED ORDER — ONDANSETRON HCL 4 MG/2ML IJ SOLN
INTRAMUSCULAR | Status: AC
  Filled 2019-06-18: qty 2

## 2019-06-18 MED ORDER — PHENYLEPHRINE 40 MCG/ML (10ML) SYRINGE FOR IV PUSH (FOR BLOOD PRESSURE SUPPORT)
PREFILLED_SYRINGE | INTRAVENOUS | Status: DC | PRN
  Administered 2019-06-18 (×3): 80 ug via INTRAVENOUS

## 2019-06-18 MED ORDER — OXYCODONE HCL 5 MG PO TABS
5.0000 mg | ORAL_TABLET | Freq: Once | ORAL | Status: AC | PRN
  Administered 2019-06-18: 5 mg via ORAL

## 2019-06-18 MED ORDER — ONDANSETRON HCL 4 MG/2ML IJ SOLN: 4.0000 mg | Freq: Once | INTRAMUSCULAR | Status: DC | PRN

## 2019-06-18 MED ORDER — FENTANYL CITRATE (PF) 100 MCG/2ML IJ SOLN
INTRAMUSCULAR | Status: DC | PRN
  Administered 2019-06-18: 50 ug via INTRAVENOUS
  Administered 2019-06-18: 100 ug via INTRAVENOUS

## 2019-06-18 MED ORDER — BUPIVACAINE HCL 0.25 % IJ SOLN
INTRAMUSCULAR | Status: AC
  Filled 2019-06-18: qty 1

## 2019-06-18 MED ORDER — PHENYLEPHRINE HCL (PRESSORS) 10 MG/ML IV SOLN
INTRAVENOUS | Status: AC
  Filled 2019-06-18: qty 1

## 2019-06-18 MED ORDER — MIDAZOLAM HCL 5 MG/5ML IJ SOLN
INTRAMUSCULAR | Status: DC | PRN
  Administered 2019-06-18: 1 mg via INTRAVENOUS

## 2019-06-18 MED ORDER — TRAMADOL HCL 50 MG PO TABS: 50.0000 mg | ORAL_TABLET | Freq: Four times a day (QID) | ORAL | 0 refills | Status: AC | PRN

## 2019-06-18 MED ORDER — BUPIVACAINE HCL 0.25 % IJ SOLN
INTRAMUSCULAR | Status: DC | PRN
  Administered 2019-06-18: 10 mL

## 2019-06-18 MED ORDER — MIDAZOLAM HCL 2 MG/2ML IJ SOLN
INTRAMUSCULAR | Status: AC
  Filled 2019-06-18: qty 2

## 2019-06-18 MED ORDER — OXYCODONE HCL 5 MG PO TABS
ORAL_TABLET | ORAL | Status: AC
  Filled 2019-06-18: qty 1

## 2019-06-18 MED ORDER — LIDOCAINE 2% (20 MG/ML) 5 ML SYRINGE
INTRAMUSCULAR | Status: DC | PRN
  Administered 2019-06-18: 80 mg via INTRAVENOUS

## 2019-06-18 MED ORDER — FENTANYL CITRATE (PF) 250 MCG/5ML IJ SOLN
INTRAMUSCULAR | Status: AC
  Filled 2019-06-18: qty 5

## 2019-06-18 MED ORDER — ONDANSETRON HCL 4 MG/2ML IJ SOLN
INTRAMUSCULAR | Status: DC | PRN
  Administered 2019-06-18: 4 mg via INTRAVENOUS

## 2019-06-18 MED ORDER — FENTANYL CITRATE (PF) 100 MCG/2ML IJ SOLN
25.0000 ug | INTRAMUSCULAR | Status: DC | PRN
  Administered 2019-06-18 (×2): 25 ug via INTRAVENOUS

## 2019-06-18 MED ORDER — CEFAZOLIN SODIUM-DEXTROSE 2-4 GM/100ML-% IV SOLN: 2.0000 g | INTRAVENOUS | Status: DC

## 2019-06-18 MED ORDER — SUGAMMADEX SODIUM 200 MG/2ML IV SOLN
INTRAVENOUS | Status: DC | PRN
  Administered 2019-06-18: 200 mg via INTRAVENOUS

## 2019-06-18 MED ORDER — LIDOCAINE 2% (20 MG/ML) 5 ML SYRINGE
INTRAMUSCULAR | Status: AC
  Filled 2019-06-18: qty 5

## 2019-06-18 MED ORDER — PROPOFOL 10 MG/ML IV BOLUS
INTRAVENOUS | Status: DC | PRN
  Administered 2019-06-18: 20 mg via INTRAVENOUS
  Administered 2019-06-18: 150 mg via INTRAVENOUS

## 2019-06-18 MED ORDER — CEFAZOLIN SODIUM-DEXTROSE 2-4 GM/100ML-% IV SOLN
2.0000 g | INTRAVENOUS | Status: AC
  Administered 2019-06-18: 2 g via INTRAVENOUS
  Filled 2019-06-18: qty 100

## 2019-06-18 SURGICAL SUPPLY — 35 items
ADH SKN CLS APL DERMABOND .7 (GAUZE/BANDAGES/DRESSINGS) ×1
APL PRP STRL LF DISP 70% ISPRP (MISCELLANEOUS) ×1
ATTRACTOMAT 16X20 MAGNETIC DRP (DRAPES) ×3 IMPLANT
BLADE SURG 15 STRL LF DISP TIS (BLADE) ×1 IMPLANT
BLADE SURG 15 STRL SS (BLADE) ×3
CHLORAPREP W/TINT 26 (MISCELLANEOUS) ×3 IMPLANT
CLIP VESOCCLUDE MED 6/CT (CLIP) ×6 IMPLANT
CLIP VESOCCLUDE SM WIDE 6/CT (CLIP) ×8 IMPLANT
COVER SURGICAL LIGHT HANDLE (MISCELLANEOUS) ×3 IMPLANT
COVER WAND RF STERILE (DRAPES) ×3 IMPLANT
DERMABOND ADVANCED (GAUZE/BANDAGES/DRESSINGS) ×2
DERMABOND ADVANCED .7 DNX12 (GAUZE/BANDAGES/DRESSINGS) ×1 IMPLANT
DISSECTOR ROUND CHERRY 3/8 STR (MISCELLANEOUS) ×2 IMPLANT
DRAPE LAPAROTOMY T 98X78 PEDS (DRAPES) ×3 IMPLANT
ELECT REM PT RETURN 15FT ADLT (MISCELLANEOUS) ×3 IMPLANT
GAUZE 4X4 16PLY RFD (DISPOSABLE) ×3 IMPLANT
GLOVE SURG ORTHO 8.0 STRL STRW (GLOVE) ×3 IMPLANT
GOWN STRL REUS W/TWL XL LVL3 (GOWN DISPOSABLE) ×9 IMPLANT
HEMOSTAT SURGICEL 2X4 FIBR (HEMOSTASIS) ×3 IMPLANT
ILLUMINATOR WAVEGUIDE N/F (MISCELLANEOUS) ×2 IMPLANT
KIT BASIN (CUSTOM PROCEDURE TRAY) ×3 IMPLANT
KIT TURNOVER KIT A (KITS) IMPLANT
NEEDLE HYPO 25X1 1.5 SAFETY (NEEDLE) ×3 IMPLANT
PACK BASIC VI WITH GOWN DISP (CUSTOM PROCEDURE TRAY) ×3 IMPLANT
PENCIL SMOKE EVACUATOR (MISCELLANEOUS) ×3 IMPLANT
SUT MNCRL AB 4-0 PS2 18 (SUTURE) ×3 IMPLANT
SUT SILK 3 0 (SUTURE) ×3
SUT SILK 3-0 18XBRD TIE 12 (SUTURE) ×1 IMPLANT
SUT VIC AB 3-0 SH 18 (SUTURE) ×3 IMPLANT
SYR BULB IRRIG 60ML STRL (SYRINGE) ×3 IMPLANT
SYR CONTROL 10ML LL (SYRINGE) ×3 IMPLANT
TOWEL OR 17X26 10 PK STRL BLUE (TOWEL DISPOSABLE) ×3 IMPLANT
TOWEL OR NON WOVEN STRL DISP B (DISPOSABLE) ×3 IMPLANT
TUBING CONNECTING 10 (TUBING) ×2 IMPLANT
TUBING CONNECTING 10' (TUBING) ×1

## 2019-06-18 NOTE — Anesthesia Procedure Notes (Signed)
Procedure Name: Intubation Date/Time: 06/18/2019 7:21 AM Performed by: Gerald Leitz, CRNA Pre-anesthesia Checklist: Patient identified, Patient being monitored, Timeout performed, Emergency Drugs available and Suction available Patient Re-evaluated:Patient Re-evaluated prior to induction Oxygen Delivery Method: Circle system utilized Preoxygenation: Pre-oxygenation with 100% oxygen Induction Type: IV induction Ventilation: Mask ventilation without difficulty Laryngoscope Size: Mac and 3 Grade View: Grade I Tube type: Oral Tube size: 7.5 mm Number of attempts: 1 Placement Confirmation: ETT inserted through vocal cords under direct vision,  positive ETCO2 and breath sounds checked- equal and bilateral Secured at: 21 cm Tube secured with: Tape Dental Injury: Teeth and Oropharynx as per pre-operative assessment

## 2019-06-18 NOTE — Op Note (Signed)
OPERATIVE REPORT - PARATHYROIDECTOMY  Preoperative diagnosis: Primary hyperparathyroidism  Postop diagnosis: Same  Procedure: Neck exploration with left inferior parathyroidectomy  Surgeon:  Armandina Gemma, MD  Anesthesia: General endotracheal  Estimated blood loss: Minimal  Preparation: ChloraPrep  Indications: Patient is referred by Dr. Shelly Coss for surgical evaluation and management of primary hyperparathyroidism. Patient was admitted to the hospital in January with multiple medical problems. He has chronic kidney disease, stage IIIB, hypertension, and anemia. He had an episode of atrial fibrillation during this hospitalization. He underwent full cardiac evaluation. Patient also has type 2 diabetes. During his evaluation he was noted to have significant hypercalcemia with calcium levels ranging from 11.7-12.5. Subsequent parathyroid hormone level was found to be elevated at 175. Patient underwent nuclear medicine parathyroid scan on March 06, 2019. This localized a left inferior parathyroid adenoma.  USN confirmed the left adenoma and also identified a nodule on the right which was indeterminant for thyroid nodule. Patient now comes to surgery for exploration and parathyroidectomy.   Procedure: The patient was prepared in the pre-operative holding area. The patient was brought to the operating room and placed in a supine position on the operating room table. Following administration of general anesthesia, the patient was positioned and then prepped and draped in the usual strict aseptic fashion. After ascertaining that an adequate level of anesthesia been achieved, a Kocher incision was made with a #15 blade. Dissection was carried through subcutaneous tissues and platysma. Hemostasis was obtained with the electrocautery. Skin flaps were developed circumferentially and a Mahorner retractor was placed for exposure.  Strap muscles were incised in the midline. Strap muscles were  reflected laterally exposing the left thyroid lobe. With gentle blunt dissection the thyroid lobe was mobilized.  Dissection was carried through adipose tissue and an enlarged parathyroid gland was identified. It was gently mobilized. Vascular structures were divided between small ligaclips. Care was taken to avoid the recurrent laryngeal nerve and the esophagus. The parathyroid gland was bilobed and quite large.  It was completely excised. It was submitted to pathology where frozen section confirmed parathyroid tissue consistent with adenoma. The gland weighed 4.5 gm.  Next we explored the right side.  Strap muscles were reflected laterally.  Right thyroid lobe was normal in size with a relatively firm nodule in the posterior inferior pole.  Mobilization allowed for identification of a normal appearing right inferior parathyroid gland.  Neck was irrigated with warm saline and good hemostasis was noted. Fibrillar was placed in the operative field. Strap muscles were approximated in the midline with interrupted 3-0 Vicryl sutures. Platysma was closed with interrupted 3-0 Vicryl sutures. Marcaine was infiltrated circumferentially. Skin was closed with a running 4-0 Monocryl subcuticular suture. Wound was washed and dried and Dermabond was applied. Patient was awakened from anesthesia and brought to the recovery room. The patient tolerated the procedure well.   Armandina Gemma, MD [facility] Office: [phone]

## 2019-06-18 NOTE — Anesthesia Postprocedure Evaluation (Signed)
Anesthesia Post Note  Patient: Martin Richards  Procedure(s) Performed: NECK EXPLORATION AND LEFT INFERIOR PARATHYROIDECTOMY (N/A )     Patient location during evaluation: PACU Anesthesia Type: General Level of consciousness: awake and alert and oriented Pain management: pain level controlled Vital Signs Assessment: post-procedure vital signs reviewed and stable Respiratory status: spontaneous breathing, nonlabored ventilation and respiratory function stable Cardiovascular status: blood pressure returned to baseline and stable Postop Assessment: no apparent nausea or vomiting Anesthetic complications: no    Last Vitals:  Vitals:   06/18/19 0900 06/18/19 0915  BP: (!) 158/77 (!) 158/73  Pulse: 80 81  Resp: (!) 8 (!) 5  Temp:    SpO2: 100% 96%    Last Pain:  Vitals:   06/18/19 0915  TempSrc:   PainSc: Asleep                 [name]

## 2019-06-18 NOTE — Interval H&P Note (Signed)
History and Physical Interval Note:  06/18/2019 7:01 AM  Martin Richards  has presented today for surgery, with the diagnosis of PRIMARY HYPERPARATHYROIDISM.  The various methods of treatment have been discussed with the patient and family. After consideration of risks, benefits and other options for treatment, the patient has consented to    Procedure(s): NECK EXPLORATION AND PARATHYROIDECTOMY, POSSIBLE MULTIPLE GLANDS (N/A) as a surgical intervention.    The patient's history has been reviewed, patient examined, no change in status, stable for surgery.  I have reviewed the patient's chart and labs.  Questions were answered to the patient's satisfaction.    Armandina Gemma, MD [facility] Office: [place]

## 2019-06-18 NOTE — Transfer of Care (Signed)
Immediate Anesthesia Transfer of  Immediate Anesthesia Transfer of Care Note  Patient: Martin Richards  Procedure(s) Performed: Procedure(s): NECK EXPLORATION AND LEFT INFERIOR PARATHYROIDECTOMY (N/A)  Patient Location: PACU  Anesthesia Type:General  Level of Consciousness: Alert, Awake, Oriented  Airway & Oxygen Therapy: Patient Spontanous Breathing  Post-op Assessment: Report given to RN  Post vital signs: Reviewed and stable  Last Vitals:  Vitals:   06/18/19 0535 06/18/19 0849  BP: (!) 168/80   Pulse: 77   Resp: 17   Temp: 36.8 C (!) (P) 36.4 C  SpO2: [id number]     Complications: No apparent anesthesia complications

## 2019-06-19 LAB — SURGICAL PATHOLOGY

## 2019-06-29 MED ORDER — TAMSULOSIN HCL 0.4 MG PO CAPS
0.40 | ORAL_CAPSULE | ORAL | Status: DC
Start: 2019-06-29 — End: 2019-06-29

## 2019-06-29 MED ORDER — PANTOPRAZOLE SODIUM 40 MG PO TBEC
40.00 | DELAYED_RELEASE_TABLET | ORAL | Status: DC
Start: 2019-06-29 — End: 2019-06-29

## 2019-06-29 MED ORDER — APIXABAN 5 MG PO TABS
5.00 | ORAL_TABLET | ORAL | Status: DC
Start: 2019-06-28 — End: 2019-06-29

## 2019-06-29 MED ORDER — DILTIAZEM HCL ER BEADS 120 MG PO CP24
120.00 | ORAL_CAPSULE | ORAL | Status: DC
Start: ? — End: 2019-06-29

## 2019-06-29 MED ORDER — ALLOPURINOL 100 MG PO TABS
100.00 | ORAL_TABLET | ORAL | Status: DC
Start: 2019-06-28 — End: 2019-06-29

## 2019-06-29 MED ORDER — ATORVASTATIN CALCIUM 40 MG PO TABS
40.00 | ORAL_TABLET | ORAL | Status: DC
Start: 2019-06-29 — End: 2019-06-29

## 2019-06-29 MED ORDER — CALCITRIOL 0.25 MCG PO CAPS
0.50 | ORAL_CAPSULE | ORAL | Status: DC
Start: 2019-06-29 — End: 2019-06-29

## 2019-06-29 MED ORDER — CHOLECALCIFEROL 25 MCG (1000 UT) PO TABS
1000.00 | ORAL_TABLET | ORAL | Status: DC
Start: 2019-06-29 — End: 2019-06-29

## 2019-06-29 MED ORDER — K PHOS MONO-SOD PHOS DI & MONO 155-852-130 MG PO TABS
250.00 | ORAL_TABLET | ORAL | Status: DC
Start: 2019-06-28 — End: 2019-06-29

## 2019-06-29 MED ORDER — FINASTERIDE 5 MG PO TABS
5.00 | ORAL_TABLET | ORAL | Status: DC
Start: 2019-06-29 — End: 2019-06-29

## 2019-06-29 MED ORDER — CHLORPROMAZINE HCL 25 MG PO TABS
25.00 | ORAL_TABLET | ORAL | Status: DC
Start: 2019-06-28 — End: 2019-06-29

## 2019-06-29 MED ORDER — POTASSIUM CHLORIDE CRYS ER 20 MEQ PO TBCR
20.00 | EXTENDED_RELEASE_TABLET | ORAL | Status: DC
Start: 2019-06-29 — End: 2019-06-29

## 2019-07-05 NOTE — Progress Notes (Deleted)
Cardiology Office Note:    Date:  07/05/2019   ID:  Martin Richards, DOB [birth year], MRN [id number]  PCP:  Willey Blade, MD  Cardiologist:  Donato Heinz, MD  Electrophysiologist:  None   Referring MD: Willey Blade, MD   No chief complaint on file.   History of Present Illness:    Martin Richards is a 79 y.o. male with a hx of diabetes, hypertension, CKD who presents for follow-up.  He was admitted to [facility] in February 2021 with dehydration and orthostatic hypotension.  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  Labs in the ED notable for AKI with creatinine 2.5, along with hypercalcemia and transaminitis.  Improved with IV fluids.  Work-up of his hypercalcemia showed elevated PTH.  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  He was started on cinacalcet and will follow up with endocrinology/general surgery as outpatient.  During admission, he went into atrial fibrillation with RVR.  This was a new diagnosis.  Rates were initially up to the 140s, improved with diltiazem drip.  He subsequently converted back to normal sinus rhythm, with rate in 60s.  He was started on Eliquis 5 mg twice daily and converted from diltiazem drip to p.o. diltiazem CD 120 mg daily.  TTE was notable for EF 65 to 70%, basal septal hypertrophy.  Cardiac MRI was planned as an outpatient to evaluate etiology (amyloidosis versus HCM versus hypertensive heart disease).  He had heme positive stool and underwent EGD/ colonoscopy.  EGD showed gastritis.  Colonoscopy showed multiple polyps, which were removed.  After discharge from the hospital, he was readmitted in [facility] shortly after discharge with a GI bleed.  Reports that Eliquis was stopped, he received multiple transfusions, and GI bleeding resolved.  He did not undergo repeat EGD/colonoscopy.  He saw gastroenterology last week, Eliquis was restarted.  Reports that GI felt  that bleed was secondary to starting Eliquis too soon after polyps were removed.  He reported no further bleeding since restarting Eliquis.    Cardiac MRI on 06/10/2019 to work-up basal septal hypertrophy, showed mild focal basal septal hypertrophy (14 mm basal anterior septum), not meeting criteria for HCM.  Normal biventricular function.  No LGE.  Since last clinic visit, he underwent parathyroidectomy on 06/18/2019.  States that he has been doing well, denies any chest pain, dyspnea, palpitations, syncope.  Does report lower extremity edema, for which his nephrologist has increased his Lasix to 40 mg twice daily.   Past Medical History:  Diagnosis Date  . Diabetes mellitus   . Dysrhythmia    Hx. AFIB  . Gout   . Hypertension     Past Surgical History:  Procedure Laterality Date  . BIOPSY  03/06/2019   Procedure: BIOPSY;  Surgeon: Carol Ada, MD;  Location: [place];  Service: Endoscopy;;  . CHOLECYSTECTOMY    . COLONOSCOPY WITH PROPOFOL N/A 03/06/2019   Procedure: COLONOSCOPY WITH PROPOFOL;  Surgeon: Carol Ada, MD;  Location: [place];  Service: Endoscopy;  Laterality: N/A;  . ESOPHAGOGASTRODUODENOSCOPY (EGD) WITH PROPOFOL N/A 03/06/2019   Procedure: ESOPHAGOGASTRODUODENOSCOPY (EGD) WITH PROPOFOL;  Surgeon: Carol Ada, MD;  Location: [place];  Service: Endoscopy;  Laterality: N/A;  . HEMOSTASIS CLIP PLACEMENT  03/06/2019   Procedure: HEMOSTASIS CLIP PLACEMENT;  Surgeon: Carol Ada, MD;  Location: [place];  Service: Endoscopy;;  . PARATHYROIDECTOMY N/A 06/18/2019   Procedure: NECK EXPLORATION AND LEFT INFERIOR PARATHYROIDECTOMY;  Surgeon: Armandina Gemma,  MD;  Location: [facility];  Service: General;  Laterality: N/A;  . POLYPECTOMY  03/06/2019   Procedure: POLYPECTOMY;  Surgeon: Carol Ada, MD;  Location: [facility] ENDOSCOPY;  Service: Endoscopy;;  . SUBMUCOSAL [place] INJECTION  03/06/2019   Procedure: SUBMUCOSAL LIFTING INJECTION;  Surgeon: Carol Ada, MD;   Location: [facility] ENDOSCOPY;  Service: Endoscopy;;    Current Medications: No outpatient medications have been marked as taking for the 07/08/19 encounter (Appointment) with Donato Heinz, MD.     Allergies:   Patient has no known allergies.   Social History   Socioeconomic History  . Marital status: Married    Spouse name: Not on file  . Number of children: Not on file  . Years of education: Not on file  . Highest education level: Not on file  Occupational History  . Not on file  Tobacco Use  . Smoking status: Never Smoker  . Smokeless tobacco: Never Used  Vaping Use  . Vaping Use: Never used  Substance and Sexual Activity  . Alcohol use: Yes    Comment: rare  . Drug use: No  . Sexual activity: Not on file  Other Topics Concern  . Not on file  Social History Narrative  . Not on file   Social Determinants of Health   Financial Resource Strain:   . Difficulty of Paying Living Expenses:   Food Insecurity:   . Worried About Charity fundraiser in the Last Year:   . Arboriculturist in the Last Year:   Transportation Needs:   . Film/video editor (Medical):   Marland Kitchen Lack of Transportation (Non-Medical):   Physical Activity:   . Days of Exercise per Week:   . Minutes of Exercise per Session:   Stress:   . Feeling of Stress :   Social Connections:   . Frequency of Communication with Friends and Family:   . Frequency of Social Gatherings with Friends and Family:   . Attends Religious Services:   . Active Member of Clubs or Organizations:   . Attends Archivist Meetings:   Marland Kitchen Marital Status:      Family History: The patient's family history includes Cancer in his sister; Diabetes in his brother; Hypertension in his brother, father, and mother.  ROS:   Please see the history of present illness.     All other systems reviewed and are negative.  EKGs/Labs/Other Studies Reviewed:    The following studies were reviewed today:   EKG:  EKG is  ordered  today.  The ekg ordered today demonstrates normal sinus rhythm with sinus arrhythmia, rate 68 ,no ST abnormalities  TTE 03/04/19: 1. Left ventricular ejection fraction, by estimation, is 65 to 70%. The  left ventricle has normal function. The left ventrical has no regional  wall motion abnormalities. There is moderately increased left ventricular  hypertrophy of the basal-septal  segment. Left ventricular diastolic function could not be evaluated  secondary to atrial fibrillation.  2. Right ventricular systolic function is normal. The right ventricular  size is normal. Tricuspid regurgitation signal is inadequate for assessing  PA pressure.  3. The mitral valve is normal in structure and function. trivial mitral  valve regurgitation. No evidence of mitral stenosis.  4. The aortic valve is tricuspid. Aortic valve regurgitation is not  visualized. Mild aortic valve sclerosis is present, with no evidence of  aortic valve stenosis.  5. The inferior vena cava is dilated in size with <50% respiratory  variability, suggesting right  atrial pressure of 15 mmHg.   Recent Labs: 03/03/2019: TSH 1.342 03/05/2019: ALT 47 03/08/2019: Magnesium 1.4 06/09/2019: BUN 17; Creatinine, Ser 1.44; Hemoglobin 12.1; Platelets 231; Potassium 3.9; Sodium 143  Recent Lipid Panel No results found for: CHOL, TRIG, HDL, CHOLHDL, VLDL, LDLCALC, LDLDIRECT  Physical Exam:    VS:  There were no vitals taken for this visit.    Wt Readings from Last 3 Encounters:  06/18/19 230 lb (104.3 kg)  06/09/19 230 lb (104.3 kg)  04/29/19 212 lb (96.2 kg)     GEN:  Well nourished, well developed in no acute distress HEENT: Normal NECK: No JVD; No carotid bruits LYMPHATICS: No lymphadenopathy CARDIAC: RRR, no murmurs, rubs, gallops RESPIRATORY:  Clear to auscultation without rales, wheezing or rhonchi  ABDOMEN: Soft, non-tender, non-distended MUSCULOSKELETAL:  No edema; No deformity  SKIN: Warm and dry NEUROLOGIC:   Alert and oriented x 3 PSYCHIATRIC:  Normal affect   ASSESSMENT:    No diagnosis found. PLAN:    In order of problems listed above:  Paroxysmal atrial fibrillation: Diagnosed during recent admission when found to have hyperparathyroidism and dehydration.  CHA2DS2-VASc score 4 (hypertension, age x2, diabetes).  Normal LV systolic function on TTE -Continue Eliquis 5 mg twice daily.  Will check CBC to ensure stable H/H given recent GI bleed -Continue diltiazem 120 mg CD daily  LVH: Cardiac MRI on 06/10/2019 to work-up basal septal hypertrophy, showed mild focal basal septal hypertrophy (14 mm basal anterior septum), not meeting criteria for HCM.  Normal biventricular function.  No LGE.  Consistent with hypertensive heart disease.  Hypertension: On diltiazem 120 mg daily.  Appears controlled  RTC in 3 months  Medication Adjustments/Labs and Tests Ordered: Current medicines are reviewed at length with the patient today.  Concerns regarding medicines are outlined above.  No orders of the defined types were placed in this encounter.  No orders of the defined types were placed in this encounter.   There are no Patient Instructions on file for this visit.   Signed, Donato Heinz, MD  07/05/2019 5:25 PM    [facility]

## 2019-07-08 ENCOUNTER — Ambulatory Visit: Payer: Medicare HMO | Admitting: Cardiology

## 2019-07-18 ENCOUNTER — Other Ambulatory Visit: Payer: Self-pay

## 2019-07-18 ENCOUNTER — Emergency Department (HOSPITAL_BASED_OUTPATIENT_CLINIC_OR_DEPARTMENT_OTHER)
Admission: EM | Admit: 2019-07-18 | Discharge: 2019-07-18 | Disposition: A | Discharge status: Home / Self Care | Payer: Medicare HMO | Attending: Emergency Medicine | Admitting: Emergency Medicine

## 2019-07-18 ENCOUNTER — Encounter (HOSPITAL_BASED_OUTPATIENT_CLINIC_OR_DEPARTMENT_OTHER): Payer: Self-pay | Admitting: Emergency Medicine

## 2019-07-18 DIAGNOSIS — Z7901 Long term (current) use of anticoagulants: Secondary | ICD-10-CM | POA: Diagnosis not present

## 2019-07-18 DIAGNOSIS — N183 Chronic kidney disease, stage 3 unspecified: Secondary | ICD-10-CM | POA: Diagnosis not present

## 2019-07-18 DIAGNOSIS — I129 Hypertensive chronic kidney disease with stage 1 through stage 4 chronic kidney disease, or unspecified chronic kidney disease: Secondary | ICD-10-CM | POA: Diagnosis not present

## 2019-07-18 DIAGNOSIS — R339 Retention of urine, unspecified: Secondary | ICD-10-CM | POA: Diagnosis not present

## 2019-07-18 DIAGNOSIS — R1030 Lower abdominal pain, unspecified: Secondary | ICD-10-CM | POA: Diagnosis not present

## 2019-07-18 DIAGNOSIS — E119 Type 2 diabetes mellitus without complications: Secondary | ICD-10-CM | POA: Diagnosis not present

## 2019-07-18 DIAGNOSIS — N390 Urinary tract infection, site not specified: Secondary | ICD-10-CM

## 2019-07-18 DIAGNOSIS — Z79899 Other long term (current) drug therapy: Secondary | ICD-10-CM | POA: Diagnosis not present

## 2019-07-18 LAB — CBC WITH DIFFERENTIAL/PLATELET
Abs Immature Granulocytes: 0.04 10*3/uL (ref 0.00–0.07)
Basophils Absolute: 0 10*3/uL (ref 0.0–0.1)
Basophils Relative: 0 %
Eosinophils Absolute: 0 10*3/uL (ref 0.0–0.5)
Eosinophils Relative: 0 %
HCT: 33.9 % — ABNORMAL LOW (ref 39.0–52.0)
Hemoglobin: 10.5 g/dL — ABNORMAL LOW (ref 13.0–17.0)
Immature Granulocytes: 0 %
Lymphocytes Relative: 9 %
Lymphs Abs: 1 10*3/uL (ref 0.7–4.0)
MCH: 23.1 pg — ABNORMAL LOW (ref 26.0–34.0)
MCHC: 31 g/dL (ref 30.0–36.0)
MCV: 74.5 fL — ABNORMAL LOW (ref 80.0–100.0)
Monocytes Absolute: 0.6 10*3/uL (ref 0.1–1.0)
Monocytes Relative: 5 %
Neutro Abs: 10.1 10*3/uL — ABNORMAL HIGH (ref 1.7–7.7)
Neutrophils Relative %: 86 %
Platelets: 285 10*3/uL (ref 150–400)
RBC: 4.55 MIL/uL (ref 4.22–5.81)
RDW: 15.9 % — ABNORMAL HIGH (ref 11.5–15.5)
WBC: 11.8 10*3/uL — ABNORMAL HIGH (ref 4.0–10.5)
nRBC: 0 % (ref 0.0–0.2)

## 2019-07-18 LAB — COMPREHENSIVE METABOLIC PANEL
ALT: 32 U/L (ref 0–44)
AST: 33 U/L (ref 15–41)
Albumin: 3.8 g/dL (ref 3.5–5.0)
Alkaline Phosphatase: 97 U/L (ref 38–126)
Anion gap: 12 (ref 5–15)
BUN: 18 mg/dL (ref 8–23)
CO2: 27 mmol/L (ref 22–32)
Calcium: 9.6 mg/dL (ref 8.9–10.3)
Chloride: 99 mmol/L (ref 98–111)
Creatinine, Ser: 1.41 mg/dL — ABNORMAL HIGH (ref 0.61–1.24)
GFR calc Af Amer: 56 mL/min — ABNORMAL LOW (ref >60)
GFR calc non Af Amer: 48 mL/min — ABNORMAL LOW (ref >60)
Glucose, Bld: 146 mg/dL — ABNORMAL HIGH (ref 70–99)
Potassium: 4 mmol/L (ref 3.5–5.1)
Sodium: 138 mmol/L (ref 135–145)
Total Bilirubin: 0.6 mg/dL (ref 0.3–1.2)
Total Protein: 7.2 g/dL (ref 6.5–8.1)

## 2019-07-18 LAB — URINALYSIS, ROUTINE W REFLEX MICROSCOPIC
Bilirubin Urine: NEGATIVE
Glucose, UA: NEGATIVE mg/dL
Ketones, ur: NEGATIVE mg/dL
Nitrite: NEGATIVE
Protein, ur: 100 mg/dL — AB
Specific Gravity, Urine: 1.025 (ref 1.005–1.030)
pH: 6 (ref 5.0–8.0)

## 2019-07-18 LAB — URINALYSIS, MICROSCOPIC (REFLEX)
Squamous Epithelial / HPF: NONE SEEN (ref 0–5)
WBC, UA: >50 WBC/hpf (ref 0–5)

## 2019-07-18 MED ORDER — CEPHALEXIN 250 MG PO CAPS
250.0000 mg | ORAL_CAPSULE | Freq: Four times a day (QID) | ORAL | 0 refills | Status: AC
Start: 2019-07-18 — End: 2019-07-25

## 2019-07-18 NOTE — ED Provider Notes (Signed)
[facility] EMERGENCY DEPARTMENT Provider Note   CSN: [id number] Arrival date & time: 07/18/19  1250     History Chief Complaint  Patient presents with  . Urinary Retention    Martin Richards is a 79 y.o. male presenting for evaluation of urinary retention.  Patient states for the past day, he has had difficulty urinating.  He feels like he needs to urinate, last night only a little would be expressed, and today he has been unable to urinate at all.  He reports pain in his suprapubic abdomen.  He reports history of similar several weeks ago, had a Foley placed which was taken out yesterday by urology.  He reports some mild discomfort with urination, no hematuria.  No fevers or chills.  He has a history of BPH.  He follows with Dr. Alinda Money from urology.  Currently reports severe suprapubic abdominal pain with intermittent spasms.  He denies back pain, flank pain, or history of kidney stones.   HPI     Past Medical History:  Diagnosis Date  . Diabetes mellitus   . Dysrhythmia    Hx. AFIB  . Gout   . Hypertension     Patient Active Problem List   Diagnosis Date Noted  . Hyperparathyroidism, primary ([place]) 06/16/2019  . Paroxysmal atrial fibrillation (HCC)   . Dehydration 03/03/2019  . Acute on chronic renal failure ([place]) 03/03/2019  . Hypercalcemia 03/03/2019  . Elevated LFTs 03/03/2019  . Rhabdomyolysis 03/03/2019  . Hypomagnesemia 03/03/2019  . Low phosphate levels 03/03/2019  . Essential hypertension 03/03/2019  . DM (diabetes mellitus), type 2 with complications ([place]) 73/41/9379  . CKD (chronic kidney disease), stage III 03/03/2019  . Hypertension     Past Surgical History:  Procedure Laterality Date  . BIOPSY  03/06/2019   Procedure: BIOPSY;  Surgeon: Carol Ada, MD;  Location: [place];  Service: Endoscopy;;  . CHOLECYSTECTOMY    . COLONOSCOPY WITH PROPOFOL N/A 03/06/2019   Procedure: COLONOSCOPY WITH PROPOFOL;  Surgeon: Carol Ada, MD;   Location: [place];  Service: Endoscopy;  Laterality: N/A;  . ESOPHAGOGASTRODUODENOSCOPY (EGD) WITH PROPOFOL N/A 03/06/2019   Procedure: ESOPHAGOGASTRODUODENOSCOPY (EGD) WITH PROPOFOL;  Surgeon: Carol Ada, MD;  Location: [place];  Service: Endoscopy;  Laterality: N/A;  . HEMOSTASIS CLIP PLACEMENT  03/06/2019   Procedure: HEMOSTASIS CLIP PLACEMENT;  Surgeon: Carol Ada, MD;  Location: [place];  Service: Endoscopy;;  . PARATHYROIDECTOMY N/A 06/18/2019   Procedure: NECK EXPLORATION AND LEFT INFERIOR PARATHYROIDECTOMY;  Surgeon: Armandina Gemma, MD;  Location[facility];  Service: General;  Laterality: N/A;  . POLYPECTOMY  03/06/2019   Procedure: POLYPECTOMY;  Surgeon: Carol Ada, MD;  Location: [place];  Service: Endoscopy;;  . SUBMUCOSAL LIFTING INJECTION  03/06/2019   Procedure: SUBMUCOSAL LIFTING INJECTION;  Surgeon: Carol Ada, MD;  [facility] ENDOSCOPY;  Service: Endoscopy;;       Family History  Problem Relation Age of Onset  . Hypertension Mother   . Hypertension Father   . Cancer Sister   . Diabetes Brother   . Hypertension Brother     Social History   Tobacco Use  . Smoking status: Never Smoker  . Smokeless tobacco: Never Used  Vaping Use  . Vaping Use: Never used  Substance Use Topics  . Alcohol use: Yes    Comment: rare  . Drug use: No    Home Medications Prior to Admission medications   Medication Sig Start Date End Date Taking? Authorizing Provider  allopurinol (ZYLOPRIM) 100 MG  tablet Take 100 mg by mouth daily.    [provider]  apixaban (ELIQUIS) 5 MG TABS tablet Take 1 tablet (5 mg total) by mouth 2 (two) times daily. 03/08/19   Shelly Coss, MD  cephALEXin (KEFLEX) 250 MG capsule Take 1 capsule (250 mg total) by mouth 4 (four) times daily for 7 days. 07/18/19 07/25/19  Lewis Keats, PA-C  cholecalciferol (VITAMIN D) 25 MCG tablet Take 1 tablet (1,000 Units total) by mouth daily. 03/09/19   Shelly Coss, MD  colchicine  0.6 MG tablet Take 0.6-1.2 mg by mouth 2 (two) times daily as needed (gout).     [provider]  diltiazem (CARDIZEM CD) 120 MG 24 hr capsule Take 1 capsule (120 mg total) by mouth daily. 03/09/19   Shelly Coss, MD  FeFum-FePoly-FA-B Cmp-C-Biot (INTEGRA PLUS) CAPS Take 1 capsule by mouth daily.  03/24/19   [provider]  finasteride (PROSCAR) 5 MG tablet Take 5 mg by mouth daily.     [provider]  furosemide (LASIX) 40 MG tablet Take 40 mg by mouth 2 (two) times daily. 03/05/19   [provider]  magnesium oxide (MAG-OX) 400 (241.3 Mg) MG tablet Take 2 tablets (800 mg total) by mouth daily. 03/09/19   Shelly Coss, MD  Multiple Vitamin (MULITIVITAMIN WITH MINERALS) TABS Take 1 tablet by mouth daily.    [provider]  pantoprazole (PROTONIX) 40 MG tablet Take 1 tablet (40 mg total) by mouth daily. 03/08/19 03/07/20  Shelly Coss, MD  phosphorus (K PHOS NEUTRAL) [phone] MG tablet Take 1 tablet (250 mg total) by mouth 3 (three) times daily. 03/08/19   Shelly Coss, MD  potassium chloride SA (KLOR-CON) 20 MEQ tablet Take 20 mEq by mouth daily.    [provider]  traMADol (ULTRAM) 50 MG tablet Take 1-2 tablets (50-100 mg total) by mouth every 6 (six) hours as needed. 06/18/19   Armandina Gemma, MD    Allergies    Patient has no known allergies.  Review of Systems   Review of Systems  Genitourinary: Positive for difficulty urinating and dysuria.       Suprapubic pain  All other systems reviewed and are negative.   Physical Exam Updated Vital Signs BP (!) 162/82   Pulse 78   Temp 98.7 F (37.1 C) (Oral)   Resp 18   Ht 6' 2.5" (1.892 m)   Wt 93 kg   SpO2 100%   BMI 25.97 kg/m   Physical Exam Vitals and nursing note reviewed.  Constitutional:      General: He is not in acute distress.    Appearance: He is well-developed.     Comments: Resting in the bed in no acute distress  HENT:     Head: Normocephalic and  atraumatic.  Eyes:     Conjunctiva/sclera: Conjunctivae normal.     Pupils: Pupils are equal, round, and reactive to light.  Cardiovascular:     Rate and Rhythm: Normal rate and regular rhythm.     Pulses: Normal pulses.  Pulmonary:     Effort: Pulmonary effort is normal. No respiratory distress.     Breath sounds: Normal breath sounds. No wheezing.  Abdominal:     General: There is no distension.     Palpations: There is no mass.     Tenderness: There is abdominal tenderness. There is no guarding or rebound.     Comments: Tenderness and firmness of suprapubic abdomen.  No tenderness palpation elsewhere in the abdomen.  No CVA tenderness.  Musculoskeletal:        General: Normal range of motion.     Cervical back: Normal range of motion and neck supple.     Right lower leg: No edema.     Left lower leg: No edema.     Comments: No pitting edema  Skin:    General: Skin is warm and dry.  Neurological:     Mental Status: He is alert and oriented to person, place, and time.     ED Results / Procedures / Treatments   Labs (all labs ordered are listed, but only abnormal results are displayed) Labs Reviewed  CBC WITH DIFFERENTIAL/PLATELET - Abnormal; Notable for the following components:      Result Value   WBC 11.8 (*)    Hemoglobin 10.5 (*)    HCT 33.9 (*)    MCV 74.5 (*)    MCH 23.1 (*)    RDW 15.9 (*)    Neutro Abs 10.1 (*)    All other components within normal limits  COMPREHENSIVE METABOLIC PANEL - Abnormal; Notable for the following components:   Glucose, Bld 146 (*)    Creatinine, Ser 1.41 (*)    GFR calc non Af Amer 48 (*)    GFR calc Af Amer 56 (*)    All other components within normal limits  URINALYSIS, ROUTINE W REFLEX MICROSCOPIC - Abnormal; Notable for the following components:   APPearance CLOUDY (*)    Hgb urine dipstick MODERATE (*)    Protein, ur 100 (*)    Leukocytes,Ua LARGE (*)    All other components within normal limits  URINALYSIS, MICROSCOPIC  (REFLEX) - Abnormal; Notable for the following components:   Bacteria, UA MANY (*)    All other components within normal limits  URINE CULTURE    EKG None  Radiology No results found.  Procedures Procedures (including critical care time)  Medications Ordered in ED Medications - No data to display  ED Course  I have reviewed the triage vital signs and the nursing notes.  Pertinent labs & imaging results that were available during my care of the patient were reviewed by me and considered in my medical decision making (see chart for details).    MDM Rules/Calculators/A&P                          Patient presenting for evaluation of urinary retention.  On exam, patient peers nontoxic.  He does have tenderness over his suprapubic abdomen, likely due to retention.  No CVA tenderness.  No fevers or chills to indicate infection, however he does report some dysuria.  Previous visit to a different ER earlier this month for the same resulted in a mild AKI, as such will check labs as well.   Bladder scan showed 484, will place Foley.  Labs interpreted by me, overall reassuring.  Creatinine similar/slightly improved from previous.  Urine consistent with infection.  In the setting of symptoms, will treat with antibiotics.  Will have patient follow-up with Dr. Alinda Money from urology.  Case discussed with attending, Dr. Ronnald Nian agrees to plan.  At this time, patient appears safe for discharge.  Return precautions given.  Patient states he understands and agrees to plan.  Final Clinical Impression(s) / ED Diagnoses Final diagnoses:  None    Rx / DC Orders ED Discharge Orders         Ordered    cephALEXin (KEFLEX) 250 MG capsule  4 times daily     Discontinue  Reprint     07/18/19 Hayes, Tracie Lindbloom, PA-C 07/18/19 [place], Merrill, DO 07/19/19 [phone]

## 2019-07-18 NOTE — Discharge Instructions (Signed)
Continue taking home medications as prescribed. Make sure you stay well-hydrated water. Take antibiotics as prescribed. Follow-up with your urologist for further evaluation of your urinary retention. Return to the emergency room with any new, worsening, concerning symptoms.

## 2019-07-18 NOTE — ED Triage Notes (Addendum)
States had a partial thyroidectomy x 1 month  and had a Foley cath placed 1 week ago for urinary retention . Was removed yesterday and has been voids small amts since this am

## 2019-07-19 ENCOUNTER — Encounter (HOSPITAL_BASED_OUTPATIENT_CLINIC_OR_DEPARTMENT_OTHER): Payer: Self-pay | Admitting: Emergency Medicine

## 2019-07-19 ENCOUNTER — Other Ambulatory Visit: Payer: Self-pay

## 2019-07-19 DIAGNOSIS — I129 Hypertensive chronic kidney disease with stage 1 through stage 4 chronic kidney disease, or unspecified chronic kidney disease: Secondary | ICD-10-CM | POA: Insufficient documentation

## 2019-07-19 DIAGNOSIS — E1122 Type 2 diabetes mellitus with diabetic chronic kidney disease: Secondary | ICD-10-CM | POA: Insufficient documentation

## 2019-07-19 DIAGNOSIS — Z79899 Other long term (current) drug therapy: Secondary | ICD-10-CM | POA: Diagnosis not present

## 2019-07-19 DIAGNOSIS — T839XXA Unspecified complication of genitourinary prosthetic device, implant and graft, initial encounter: Secondary | ICD-10-CM | POA: Insufficient documentation

## 2019-07-19 DIAGNOSIS — N182 Chronic kidney disease, stage 2 (mild): Secondary | ICD-10-CM | POA: Insufficient documentation

## 2019-07-19 NOTE — ED Triage Notes (Signed)
Pt reports foley catheter placed on Friday and reports leaking around catheter at urethra. Also endorses burning.

## 2019-07-20 ENCOUNTER — Emergency Department (HOSPITAL_BASED_OUTPATIENT_CLINIC_OR_DEPARTMENT_OTHER)
Admission: EM | Admit: 2019-07-20 | Discharge: 2019-07-20 | Disposition: A | Discharge status: Home / Self Care | Payer: Medicare HMO | Attending: Emergency Medicine | Admitting: Emergency Medicine

## 2019-07-20 DIAGNOSIS — T839XXA Unspecified complication of genitourinary prosthetic device, implant and graft, initial encounter: Secondary | ICD-10-CM

## 2019-07-20 NOTE — ED Notes (Signed)
Catheter flushed with 20 mL of NS. Catheter appears to be draining properly. Pt states he does not feel like he has any urine in his bladder at this time.

## 2019-07-20 NOTE — Discharge Instructions (Addendum)
You were seen today and your Foley catheter was flushed.  It appears to be working properly.  Follow-up with urology as previously recommended.

## 2019-07-20 NOTE — ED Provider Notes (Signed)
[name] EMERGENCY DEPARTMENT Provider Note   CSN: [id number] Arrival date & time: 07/19/19  2315     History Chief Complaint  Patient presents with  . catheter problem    Martin Richards is a 79 y.o. male.  HPI     This is a 79 year old male who presents with Foley catheter issue.  He had a Foley catheter placed on Friday for urinary retention and a urinary tract infection.  He states that the catheter has been draining well but then he started to note leakage at his urethral meatus.  Catheter still appears to drain but he states "I do not think it is in the right place."  He reports that he still feels somewhat full in his bladder and has an urge to urinate now.  He denies any fevers or back pain.  He is taking his antibiotics.  Past Medical History:  Diagnosis Date  . Diabetes mellitus   . Dysrhythmia    Hx. AFIB  . Gout   . Hypertension     Patient Active Problem List   Diagnosis Date Noted  . Hyperparathyroidism, primary ([place]) 06/16/2019  . Paroxysmal atrial fibrillation (HCC)   . Dehydration 03/03/2019  . Acute on chronic renal failure ([place]) 03/03/2019  . Hypercalcemia 03/03/2019  . Elevated LFTs 03/03/2019  . Rhabdomyolysis 03/03/2019  . Hypomagnesemia 03/03/2019  . Low phosphate levels 03/03/2019  . Essential hypertension 03/03/2019  . DM (diabetes mellitus), type 2 with complications ([place]) 16/07/3708  . CKD (chronic kidney disease), stage III 03/03/2019  . Hypertension     Past Surgical History:  Procedure Laterality Date  . BIOPSY  03/06/2019   Procedure: BIOPSY;  Surgeon: Carol Ada, MD;  Location: [facility];  Service: Endoscopy;;  . CHOLECYSTECTOMY    . COLONOSCOPY WITH PROPOFOL N/A 03/06/2019   Procedure: COLONOSCOPY WITH PROPOFOL;  Surgeon: Carol Ada, MD;  Location: [place];  Service: Endoscopy;  Laterality: N/A;  . ESOPHAGOGASTRODUODENOSCOPY (EGD) WITH PROPOFOL N/A 03/06/2019   Procedure: ESOPHAGOGASTRODUODENOSCOPY  (EGD) WITH PROPOFOL;  Surgeon: Carol Ada, MD;  Location: [facility];  Service: Endoscopy;  Laterality: N/A;  . HEMOSTASIS CLIP PLACEMENT  03/06/2019   Procedure: HEMOSTASIS CLIP PLACEMENT;  Surgeon: Carol Ada, MD;  Location: [facility];  Service: Endoscopy;;  . PARATHYROIDECTOMY N/A 06/18/2019   Procedure: NECK EXPLORATION AND LEFT INFERIOR PARATHYROIDECTOMY;  Surgeon: Armandina Gemma, MD;  Location: [facility];  Service: General;  Laterality: N/A;  . POLYPECTOMY  03/06/2019   Procedure: POLYPECTOMY;  Surgeon: Carol Ada, MD;  Location: [facility];  Service: Endoscopy;;  . SUBMUCOSAL LIFTING INJECTION  03/06/2019   Procedure: SUBMUCOSAL LIFTING INJECTION;  Surgeon: Carol Ada, MD;  Location: [facility] ENDOSCOPY;  Service: Endoscopy;;       Family History  Problem Relation Age of Onset  . Hypertension Mother   . Hypertension Father   . Cancer Sister   . Diabetes Brother   . Hypertension Brother     Social History   Tobacco Use  . Smoking status: Never Smoker  . Smokeless tobacco: Never Used  Vaping Use  . Vaping Use: Never used  Substance Use Topics  . Alcohol use: Yes    Comment: rare  . Drug use: No    Home Medications Prior to Admission medications   Medication Sig Start Date End Date Taking? Authorizing Provider  allopurinol (ZYLOPRIM) 100 MG tablet Take 100 mg by mouth daily.    [provider]  apixaban (ELIQUIS) 5 MG TABS tablet Take 1 tablet (  5 mg total) by mouth 2 (two) times daily. 03/08/19   Shelly Coss, MD  cephALEXin (KEFLEX) 250 MG capsule Take 1 capsule (250 mg total) by mouth 4 (four) times daily for 7 days. 07/18/19 07/25/19  Caccavale, Sophia, PA-C  cholecalciferol (VITAMIN D) 25 MCG tablet Take 1 tablet (1,000 Units total) by mouth daily. 03/09/19   Shelly Coss, MD  colchicine 0.6 MG tablet Take 0.6-1.2 mg by mouth 2 (two) times daily as needed (gout).     [provider]  diltiazem (CARDIZEM CD) 120 MG 24 hr capsule Take 1 capsule  (120 mg total) by mouth daily. 03/09/19   Shelly Coss, MD  FeFum-FePoly-FA-B Cmp-C-Biot (INTEGRA PLUS) CAPS Take 1 capsule by mouth daily.  03/24/19   [provider]  finasteride (PROSCAR) 5 MG tablet Take 5 mg by mouth daily.     [provider]  furosemide (LASIX) 40 MG tablet Take 40 mg by mouth 2 (two) times daily. 03/05/19   [provider]  magnesium oxide (MAG-OX) 400 (241.3 Mg) MG tablet Take 2 tablets (800 mg total) by mouth daily. 03/09/19   Shelly Coss, MD  Multiple Vitamin (MULITIVITAMIN WITH MINERALS) TABS Take 1 tablet by mouth daily.    [provider]  pantoprazole (PROTONIX) 40 MG tablet Take 1 tablet (40 mg total) by mouth daily. 03/08/19 03/07/20  Shelly Coss, MD  phosphorus (K PHOS NEUTRAL) [phone] MG tablet Take 1 tablet (250 mg total) by mouth 3 (three) times daily. 03/08/19   Shelly Coss, MD  potassium chloride SA (KLOR-CON) 20 MEQ tablet Take 20 mEq by mouth daily.    [provider]  traMADol (ULTRAM) 50 MG tablet Take 1-2 tablets (50-100 mg total) by mouth every 6 (six) hours as needed. 06/18/19   Armandina Gemma, MD    Allergies    Patient has no known allergies.  Review of Systems   Review of Systems  Constitutional: Negative for fever.  Genitourinary: Positive for difficulty urinating and dysuria.       Foley catheter problem  All other systems reviewed and are negative.   Physical Exam Updated Vital Signs BP (!) 161/79 (BP Location: Right Arm)   Pulse 71   Temp 98.3 F (36.8 C) (Oral)   Resp 17   Wt 89.3 kg   SpO2 98%   BMI 24.93 kg/m   Physical Exam Vitals and nursing note reviewed.  Constitutional:      Appearance: He is well-developed. He is not ill-appearing.  HENT:     Head: Normocephalic and atraumatic.     Mouth/Throat:     Mouth: Mucous membranes are moist.  Eyes:     Pupils: Pupils are equal, round, and reactive to light.  Cardiovascular:     Rate and Rhythm: Normal rate and  regular rhythm.  Pulmonary:     Effort: Pulmonary effort is normal. No respiratory distress.  Abdominal:     Palpations: Abdomen is soft.     Tenderness: There is no abdominal tenderness. There is no guarding or rebound.  Genitourinary:    Comments: Foley catheter in place, appears to be draining appropriately into leg bag Musculoskeletal:     Cervical back: Neck supple.     Right lower leg: No edema.     Left lower leg: No edema.  Lymphadenopathy:     Cervical: No cervical adenopathy.  Skin:    General: Skin is warm and dry.  Neurological:     Mental Status: He is alert and oriented  to person, place, and time.  Psychiatric:        Mood and Affect: Mood normal.     ED Results / Procedures / Treatments   Labs (all labs ordered are listed, but only abnormal results are displayed) Labs Reviewed - No data to display  EKG None  Radiology No results found.  Procedures Procedures (including critical care time)  Medications Ordered in ED Medications - No data to display  ED Course  I have reviewed the triage vital signs and the nursing notes.  Pertinent labs & imaging results that were available during my care of the patient were reviewed by me and considered in my medical decision making (see chart for details).    MDM Rules/Calculators/A&P                          Patient presents with concerns his Foley catheter is not functioning properly.  He reports leakage at the urethral meatus.  He is overall nontoxic and vital signs largely reassuring.  Foley catheter appears to be draining appropriately.  It was flushed with 20 cc of normal saline by nursing and there did not appear to be any obvious clot.  They noted appropriate drainage into the bag.  He was provided with a larger leg bag.  Patient states that he is currently asymptomatic.  He did report some urgency to urinate but is being treated for urinary tract infection.  Given that he is clinically well-appearing without  fever back pain, do not feel he needs repeat urinalysis or culture.  Recommend urology follow-up as previously recommended.  After history, exam, and medical workup I feel the patient has been appropriately medically screened and is safe for discharge home. Pertinent diagnoses were discussed with the patient. Patient was given return precautions.  Final Clinical Impression(s) / ED Diagnoses Final diagnoses:  Problem with Foley catheter, initial encounter [facility])    Rx / DC Orders ED Discharge Orders    None       Keanon Bevins, Barbette Hair, MD 07/20/19 [phone]

## 2019-07-21 LAB — URINE CULTURE: Culture: >=100000 — AB

## 2019-07-28 NOTE — Progress Notes (Signed)
Cardiology Office Note:    Date:  07/29/2019   ID:  GARHETT BERNHARD, DOB 16-Mar-1940, MRN [id number]  PCP:  Willey Blade, MD  Cardiologist:  Donato Heinz, MD  Electrophysiologist:  None   Referring MD: Willey Blade, MD   No chief complaint on file.   History of Present Illness:    TIGER SPIEKER is a 79 y.o. male with a hx of diabetes, hypertension, CKD who presents for follow-up.  He was admitted to [facility] in February 2021 with dehydration and orthostatic hypotension.  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  Labs in the ED notable for AKI with creatinine 2.5, along with hypercalcemia and transaminitis.  Improved with IV fluids.  Work-up of his hypercalcemia showed elevated PTH.  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  He was started on cinacalcet and will follow up with endocrinology/general surgery as outpatient.  During admission, he went into atrial fibrillation with RVR.  This was a new diagnosis.  Rates were initially up to the 140s, improved with diltiazem drip.  He subsequently converted back to normal sinus rhythm, with rate in 60s.  He was started on Eliquis 5 mg twice daily and converted from diltiazem drip to p.o. diltiazem CD 120 mg daily.  TTE was notable for EF 65 to 70%, basal septal hypertrophy.  Cardiac MRI was planned as an outpatient to evaluate etiology (amyloidosis versus HCM versus hypertensive heart disease).  He had heme positive stool and underwent EGD/ colonoscopy.  EGD showed gastritis.  Colonoscopy showed multiple polyps, which were removed.  After discharge from the hospital, he was readmitted in [facility] shortly after discharge with a GI bleed.  Reports that Eliquis was stopped, he received multiple transfusions, and GI bleeding resolved.  He did not undergo repeat EGD/colonoscopy.  He saw gastroenterology last week, Eliquis was restarted.  Reports that GI felt that  bleed was secondary to starting Eliquis too soon after polyps were removed.  He reported no further bleeding since restarting Eliquis.    Cardiac MRI on 06/10/2019 to work-up basal septal hypertrophy, showed mild focal basal septal hypertrophy (14 mm basal anterior septum), not meeting criteria for HCM.  Normal biventricular function.  No LGE.  Since last clinic visit, he underwent parathyroidectomy on 06/18/2019.  Reports he has been doing well.  He denies any chest pain, dyspnea, palpitations, lightheadedness, or syncope.  States that his lower extremity edema has resolved.  He has been taking Eliquis, denies any bleeding issues.    BP Readings from Last 3 Encounters:  07/29/19 140/83  07/20/19 (!) 150/74  07/18/19 (!) 162/82      Past Medical History:  Diagnosis Date  . Diabetes mellitus   . Dysrhythmia    Hx. AFIB  . Gout   . Hypertension     Past Surgical History:  Procedure Laterality Date  . BIOPSY  03/06/2019   Procedure: BIOPSY;  Surgeon: Carol Ada, MD;  Location: [place];  Service: Endoscopy;;  . CHOLECYSTECTOMY    . COLONOSCOPY WITH PROPOFOL N/A 03/06/2019   Procedure: COLONOSCOPY WITH PROPOFOL;  Surgeon: Carol Ada, MD;  Location: [place];  Service: Endoscopy;  Laterality: N/A;  . ESOPHAGOGASTRODUODENOSCOPY (EGD) WITH PROPOFOL N/A 03/06/2019   Procedure: ESOPHAGOGASTRODUODENOSCOPY (EGD) WITH PROPOFOL;  Surgeon: Carol Ada, MD;  Location: [place];  Service: Endoscopy;  Laterality: N/A;  . HEMOSTASIS CLIP PLACEMENT  03/06/2019   Procedure: HEMOSTASIS CLIP PLACEMENT;  Surgeon: Carol Ada,  MD;  Location: [place];  Service: Endoscopy;;  . PARATHYROIDECTOMY N/A 06/18/2019   Procedure: NECK EXPLORATION AND LEFT INFERIOR PARATHYROIDECTOMY;  Surgeon: Armandina Gemma, MD;  Location[facility];  Service: General;  Laterality: N/A;  . POLYPECTOMY  03/06/2019   Procedure: POLYPECTOMY;  Surgeon: Carol Ada, MD;  Location[facility] END[facility]COPY;  Service: Endoscopy;;   . SUBMUCOSAL LIFTING INJECTION  03/06/2019   Procedure: SUBMUCOSAL LIFTING INJECTION;  Surgeon: Carol Ada, MD;  Location: [place];  Service: Endoscopy;;    Current Medications: Current Meds  Medication Sig  . allopurinol (ZYLOPRIM) 100 MG tablet Take 100 mg by mouth daily.  Marland Kitchen apixaban (ELIQUIS) 5 MG TABS tablet Take 1 tablet (5 mg total) by mouth 2 (two) times daily.  . cholecalciferol (VITAMIN D) 25 MCG tablet Take 1 tablet (1,000 Units total) by mouth daily.  . colchicine 0.6 MG tablet Take 0.6-1.2 mg by mouth 2 (two) times daily as needed (gout).   Marland Kitchen diltiazem (CARDIZEM CD) 180 MG 24 hr capsule Take 1 capsule (180 mg total) by mouth daily.  Marland Kitchen FeFum-FePoly-FA-B Cmp-C-Biot (INTEGRA PLUS) CAPS Take 1 capsule by mouth daily.   . finasteride (PROSCAR) 5 MG tablet Take 5 mg by mouth daily.   . furosemide (LASIX) 40 MG tablet Take 40 mg by mouth 2 (two) times daily.  . magnesium oxide (MAG-OX) 400 (241.3 Mg) MG tablet Take 2 tablets (800 mg total) by mouth daily.  . Multiple Vitamin (MULITIVITAMIN WITH MINERALS) TABS Take 1 tablet by mouth daily.  . pantoprazole (PROTONIX) 40 MG tablet Take 1 tablet (40 mg total) by mouth daily.  . phosphorus (K PHOS NEUTRAL) 155-852-130 MG tablet Take 1 tablet (250 mg total) by mouth 3 (three) times daily.  . potassium chloride SA (KLOR-CON) 20 MEQ tablet Take 20 mEq by mouth daily.  . traMADol (ULTRAM) 50 MG tablet Take 1-2 tablets (50-100 mg total) by mouth every 6 (six) hours as needed.  . [DISCONTINUED] diltiazem (CARDIZEM CD) 120 MG 24 hr capsule Take 1 capsule (120 mg total) by mouth daily.     Allergies:   Patient has no known allergies.   Social History   Socioeconomic History  . Marital status: Married    Spouse name: Not on file  . Number of children: Not on file  . Years of education: Not on file  . Highest education level: Not on file  Occupational History  . Not on file  Tobacco Use  . Smoking status: Never Smoker  .  Smokeless tobacco: Never Used  Vaping Use  . Vaping Use: Never used  Substance and Sexual Activity  . Alcohol use: Yes    Comment: rare  . Drug use: No  . Sexual activity: Not on file  Other Topics Concern  . Not on file  Social History Narrative  . Not on file   Social Determinants of Health   Financial Resource Strain:   . Difficulty of Paying Living Expenses:   Food Insecurity:   . Worried About Charity fundraiser in the Last Year:   . Arboriculturist in the Last Year:   Transportation Needs:   . Film/video editor (Medical):   Marland Kitchen Lack of Transportation (Non-Medical):   Physical Activity:   . Days of Exercise per Week:   . Minutes of Exercise per Session:   Stress:   . Feeling of Stress :   Social Connections:   . Frequency of Communication with Friends and Family:   . Frequency  of Social Gatherings with Friends and Family:   . Attends Religious Services:   . Active Member of Clubs or Organizations:   . Attends Archivist Meetings:   Marland Kitchen Marital Status:      Family History: The patient's family history includes Cancer in his sister; Diabetes in his brother; Hypertension in his brother, father, and mother.  ROS:   Please see the history of present illness.     All other systems reviewed and are negative.  EKGs/Labs/Other Studies Reviewed:    The following studies were reviewed today:   EKG:  EKG is  ordered today.  The ekg ordered today demonstrates normal sinus rhythm with sinus arrhythmia, rate 77 , first-degree AV block, no ST abnormalities  TTE 03/04/19: 1. Left ventricular ejection fraction, by estimation, is 65 to 70%. The  left ventricle has normal function. The left ventrical has no regional  wall motion abnormalities. There is moderately increased left ventricular  hypertrophy of the basal-septal  segment. Left ventricular diastolic function could not be evaluated  secondary to atrial fibrillation.  2. Right ventricular systolic  function is normal. The right ventricular  size is normal. Tricuspid regurgitation signal is inadequate for assessing  PA pressure.  3. The mitral valve is normal in structure and function. trivial mitral  valve regurgitation. No evidence of mitral stenosis.  4. The aortic valve is tricuspid. Aortic valve regurgitation is not  visualized. Mild aortic valve sclerosis is present, with no evidence of  aortic valve stenosis.  5. The inferior vena cava is dilated in size with <50% respiratory  variability, suggesting right atrial pressure of 15 mmHg.   Recent Labs: 03/03/2019: TSH 1.342 03/08/2019: Magnesium 1.4 07/18/2019: ALT 32; BUN 18; Creatinine, Ser 1.41; Hemoglobin 10.5; Platelets 285; Potassium 4.0; Sodium 138  Recent Lipid Panel No results found for: CHOL, TRIG, HDL, CHOLHDL, VLDL, LDLCALC, LDLDIRECT  Physical Exam:    VS:  BP 140/83   Pulse 77   Temp (!) 95.2 F (35.1 C)   Ht 6\' 2"  (1.88 m)   Wt 198 lb 3.2 oz (89.9 kg)   SpO2 98%   BMI 25.45 kg/m     Wt Readings from Last 3 Encounters:  07/29/19 198 lb 3.2 oz (89.9 kg)  07/19/19 196 lb 12.8 oz (89.3 kg)  07/18/19 205 lb (93 kg)     GEN:  Well nourished, well developed in no acute distress HEENT: Normal NECK: No JVD; No carotid bruits LYMPHATICS: No lymphadenopathy CARDIAC: RRR, no murmurs, rubs, gallops RESPIRATORY:  Clear to auscultation without rales, wheezing or rhonchi  ABDOMEN: Soft, non-tender, non-distended MUSCULOSKELETAL:  No edema; No deformity  SKIN: Warm and dry NEUROLOGIC:  Alert and oriented x 3 PSYCHIATRIC:  Normal affect   ASSESSMENT:    1. Paroxysmal atrial fibrillation (HCC)   2. LVH (left ventricular hypertrophy)   3. Essential hypertension    PLAN:    Paroxysmal atrial fibrillation: Diagnosed during recent admission when found to have hyperparathyroidism and dehydration.  CHA2DS2-VASc score 4 (hypertension, age x2, diabetes).  Normal LV systolic function on TTE -Continue Eliquis 5 mg  twice daily.  -Continue diltiazem 120 mg CD daily.  BP elevated, will increase to diltiazem 180 mg daily.  LVH: Cardiac MRI on 06/10/2019 to work-up basal septal hypertrophy, showed mild focal basal septal hypertrophy (14 mm basal anterior septum), not meeting criteria for HCM.  Normal biventricular function.  No LGE.  Consistent with hypertensive heart disease.  Hypertension: On diltiazem 120 mg daily.  BP elevated, will increase to diltiazem 180 mg daily.  Asked patient to check BP daily for next 2 weeks and call with results.  RTC in 3 months  Medication Adjustments/Labs and Tests Ordered: Current medicines are reviewed at length with the patient today.  Concerns regarding medicines are outlined above.  Orders Placed This Encounter  Procedures  . EKG 12-Lead   Meds ordered this encounter  Medications  . diltiazem (CARDIZEM CD) 180 MG 24 hr capsule    Sig: Take 1 capsule (180 mg total) by mouth daily.    Dispense:  90 capsule    Refill:  3    Dose increase    Patient Instructions  Medication Instructions:  INCREASE diltiazem to 180 mg daily  *If you need a refill on your cardiac medications before your next appointment, please call your pharmacy*  Follow-Up: At [facility], you and your health needs are our priority.  As part of our continuing mission to provide you with exceptional heart care, we have created designated Provider Care Teams.  These Care Teams include your primary Cardiologist (physician) and Advanced Practice Providers (APPs -  Physician Assistants and Nurse Practitioners) who all work together to provide you with the care you need, when you need it.  We recommend signing up for the patient portal called "MyChart".  Sign up information is provided on this After Visit Summary.  MyChart is used to connect with patients for Virtual Visits (Telemedicine).  Patients are able to view lab/test results, encounter notes, upcoming appointments, etc.  Non-urgent messages  can be sent to your provider as well.   To learn more about what you can do with MyChart, go to [email or web address].    Your next appointment:   6 month(s)  The format for your next appointment:   In Person  Provider:   Oswaldo Milian, MD   Other Instructions Please check your blood pressure at home daily, write it down.  Call the office or send message via Mychart with the readings in 2 weeks for Dr. Gardiner Rhyme to review.       Signed, Donato Heinz, MD  07/29/2019 11:05 PM    [place]

## 2019-07-29 ENCOUNTER — Encounter: Payer: Self-pay | Admitting: Cardiology

## 2019-07-29 ENCOUNTER — Other Ambulatory Visit: Payer: Self-pay

## 2019-07-29 ENCOUNTER — Ambulatory Visit (INDEPENDENT_AMBULATORY_CARE_PROVIDER_SITE_OTHER): Payer: Medicare HMO | Admitting: Cardiology

## 2019-07-29 VITALS — BP 140/83 | HR 77 | Temp 95.2°F | Ht 74.0 in | Wt 198.2 lb

## 2019-07-29 DIAGNOSIS — I517 Cardiomegaly: Secondary | ICD-10-CM

## 2019-07-29 DIAGNOSIS — I480 Paroxysmal atrial fibrillation: Secondary | ICD-10-CM | POA: Diagnosis not present

## 2019-07-29 DIAGNOSIS — I10 Essential (primary) hypertension: Secondary | ICD-10-CM

## 2019-07-29 MED ORDER — DILTIAZEM HCL ER COATED BEADS 180 MG PO CP24: 180.0000 mg | ORAL_CAPSULE | Freq: Every day | ORAL | 3 refills | Status: DC

## 2019-07-29 NOTE — Patient Instructions (Signed)
Medication Instructions:  INCREASE diltiazem to 180 mg daily  *If you need a refill on your cardiac medications before your next appointment, please call your pharmacy*  Follow-Up: At [facility], you and your health needs are our priority.  As part of our continuing mission to provide you with exceptional heart care, we have created designated Provider Care Teams.  These Care Teams include your primary Cardiologist (physician) and Advanced Practice Providers (APPs -  Physician Assistants and Nurse Practitioners) who all work together to provide you with the care you need, when you need it.  We recommend signing up for the patient portal called "MyChart".  Sign up information is provided on this After Visit Summary.  MyChart is used to connect with patients for Virtual Visits (Telemedicine).  Patients are able to view lab/test results, encounter notes, upcoming appointments, etc.  Non-urgent messages can be sent to your provider as well.   To learn more about what you can do with MyChart, go to [email or web address].    Your next appointment:   6 month(s)  The format for your next appointment:   In Person  Provider:   Oswaldo Milian, MD   Other Instructions Please check your blood pressure at home daily, write it down.  Call the office or send message via Mychart with the readings in 2 weeks for Dr. Gardiner Rhyme to review.

## 2019-12-25 ENCOUNTER — Ambulatory Visit
Admission: RE | Admit: 2019-12-25 | Discharge: 2019-12-25 | Disposition: A | Discharge status: Home / Self Care | Payer: Medicare HMO | Source: Ambulatory Visit | Attending: Internal Medicine | Admitting: Internal Medicine

## 2019-12-25 ENCOUNTER — Other Ambulatory Visit: Payer: Self-pay | Admitting: Internal Medicine

## 2019-12-25 DIAGNOSIS — M545 Low back pain, unspecified: Secondary | ICD-10-CM

## 2020-01-24 NOTE — Progress Notes (Deleted)
Cardiology Office Note:    Date:  01/24/2020   ID:  Gibbs, Strough [birth year], MRN [id number]  PCP:  Willey Blade, MD  Cardiologist:  Donato Heinz, MD  Electrophysiologist:  None   Referring MD: Willey Blade, MD   No chief complaint on file.   History of Present Illness:    Martin Richards is a 80 y.o. male with a hx of diabetes, hypertension, CKD, hyperparathyroidism status post parathyroidectomy 05/2019 who presents for follow-up.  He was admitted to [facility] in February 2021 with dehydration and orthostatic hypotension.  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  Labs in the ED notable for AKI with creatinine 2.5, along with hypercalcemia and transaminitis.  Improved with IV fluids.  Work-up of his hypercalcemia showed elevated PTH.  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  He was started on cinacalcet and will follow up with endocrinology/general surgery as outpatient.  During admission, he went into atrial fibrillation with RVR.  This was a new diagnosis.  Rates were initially up to the 140s, improved with diltiazem drip.  He subsequently converted back to normal sinus rhythm, with rate in 60s.  He was started on Eliquis 5 mg twice daily and converted from diltiazem drip to p.o. diltiazem CD 120 mg daily.  TTE was notable for EF 65 to 70%, basal septal hypertrophy.  Cardiac MRI was planned as an outpatient to evaluate etiology (amyloidosis versus HCM versus hypertensive heart disease).  He had heme positive stool and underwent EGD/ colonoscopy.  EGD showed gastritis.  Colonoscopy showed multiple polyps, which were removed.  After discharge from the hospital, he was readmitted in [facility] shortly after discharge with a GI bleed.  Reports that Eliquis was stopped, he received multiple transfusions, and GI bleeding resolved.  He did not undergo repeat EGD/colonoscopy.  He saw gastroenterology  and Eliquis restarted.  Reports that GI felt that bleed was secondary to starting Eliquis too soon after polyps were removed.  He reported no further bleeding since restarting Eliquis.    Cardiac MRI on 06/10/2019 to work-up basal septal hypertrophy, showed mild focal basal septal hypertrophy (14 mm basal anterior septum), not meeting criteria for HCM.  Normal biventricular function.  No LGE.  Since last clinic visit,   he underwent parathyroidectomy on 06/18/2019.  Reports he has been doing well.  He denies any chest pain, dyspnea, palpitations, lightheadedness, or syncope.  States that his lower extremity edema has resolved.  He has been taking Eliquis, denies any bleeding issues.    BP Readings from Last 3 Encounters:  07/29/19 140/83  07/20/19 (!) 150/74  07/18/19 (!) 162/82      Past Medical History:  Diagnosis Date  . Diabetes mellitus   . Dysrhythmia    Hx. AFIB  . Gout   . Hypertension     Past Surgical History:  Procedure Laterality Date  . BIOPSY  03/06/2019   Procedure: BIOPSY;  Surgeon: Carol Ada, MD;  Location: [place];  Service: Endoscopy;;  . CHOLECYSTECTOMY    . COLONOSCOPY WITH PROPOFOL N/A 03/06/2019   Procedure: COLONOSCOPY WITH PROPOFOL;  Surgeon: Carol Ada, MD;  Location: [place];  Service: Endoscopy;  Laterality: N/A;  . ESOPHAGOGASTRODUODENOSCOPY (EGD) WITH PROPOFOL N/A 03/06/2019   Procedure: ESOPHAGOGASTRODUODENOSCOPY (EGD) WITH PROPOFOL;  Surgeon: Carol Ada, MD;  Location: [place];  Service: Endoscopy;  Laterality: N/A;  . HEMOSTASIS CLIP PLACEMENT  03/06/2019   Procedure: HEMOSTASIS CLIP  PLACEMENT;  Surgeon: Jeani Hawking, MD;  Location: [facility] ENDOSCOPY;  Service: Endoscopy;;  . PARATHYROIDECTOMY N/A 06/18/2019   Procedure: NECK EXPLORATION AND LEFT INFERIOR PARATHYROIDECTOMY;  Surgeon: Darnell Level, MD;  Location: [facility];  Service: General;  Laterality: N/A;  . POLYPECTOMY  03/06/2019   Procedure: POLYPECTOMY;  Surgeon: Jeani Hawking, MD;  Location: [facility] ENDOSCOPY;  Service: Endoscopy;;  . SUBMUCOSAL LIFTING INJECTION  03/06/2019   Procedure: SUBMUCOSAL LIFTING INJECTION;  Surgeon: Jeani Hawking, MD;  Location: [facility] ENDOSCOPY;  Service: Endoscopy;;    Current Medications: No outpatient medications have been marked as taking for the 01/25/20 encounter (Appointment) with Little Ishikawa, MD.     Allergies:   Patient has no known allergies.   Social History   Socioeconomic History  . Marital status: Married    Spouse name: Not on file  . Number of children: Not on file  . Years of education: Not on file  . Highest education level: Not on file  Occupational History  . Not on file  Tobacco Use  . Smoking status: Never Smoker  . Smokeless tobacco: Never Used  Vaping Use  . Vaping Use: Never used  Substance and Sexual Activity  . Alcohol use: Yes    Comment: rare  . Drug use: No  . Sexual activity: Not on file  Other Topics Concern  . Not on file  Social History Narrative  . Not on file   Social Determinants of Health   Financial Resource Strain: Not on file  Food Insecurity: Not on file  Transportation Needs: Not on file  Physical Activity: Not on file  Stress: Not on file  Social Connections: Not on file     Family History: The patient's family history includes Cancer in his sister; Diabetes in his brother; Hypertension in his brother, father, and mother.  ROS:   Please see the history of present illness.     All other systems reviewed and are negative.  EKGs/Labs/Other Studies Reviewed:    The following studies were reviewed today:   EKG:  EKG is  ordered today.  The ekg ordered today demonstrates normal sinus rhythm with sinus arrhythmia, rate 77 , first-degree AV block, no ST abnormalities  TTE 03/04/19: 1. Left ventricular ejection fraction, by estimation, is 65 to 70%. The  left ventricle has normal function. The left ventrical has no regional  wall motion abnormalities.  There is moderately increased left ventricular  hypertrophy of the basal-septal  segment. Left ventricular diastolic function could not be evaluated  secondary to atrial fibrillation.  2. Right ventricular systolic function is normal. The right ventricular  size is normal. Tricuspid regurgitation signal is inadequate for assessing  PA pressure.  3. The mitral valve is normal in structure and function. trivial mitral  valve regurgitation. No evidence of mitral stenosis.  4. The aortic valve is tricuspid. Aortic valve regurgitation is not  visualized. Mild aortic valve sclerosis is present, with no evidence of  aortic valve stenosis.  5. The inferior vena cava is dilated in size with <50% respiratory  variability, suggesting right atrial pressure of 15 mmHg.   Recent Labs: 03/03/2019: TSH 1.342 03/08/2019: Magnesium 1.4 07/18/2019: ALT 32; BUN 18; Creatinine, Ser 1.41; Hemoglobin 10.5; Platelets 285; Potassium 4.0; Sodium 138  Recent Lipid Panel No results found for: CHOL, TRIG, HDL, CHOLHDL, VLDL, LDLCALC, LDLDIRECT  Physical Exam:    VS:  There were no vitals taken for this visit.    Wt Readings from Last 3 Encounters:  07/29/19 198 lb 3.2 oz (89.9 kg)  07/19/19 196 lb 12.8 oz (89.3 kg)  07/18/19 205 lb (93 kg)     GEN:  Well nourished, well developed in no acute distress HEENT: Normal NECK: No JVD; No carotid bruits LYMPHATICS: No lymphadenopathy CARDIAC: RRR, no murmurs, rubs, gallops RESPIRATORY:  Clear to auscultation without rales, wheezing or rhonchi  ABDOMEN: Soft, non-tender, non-distended MUSCULOSKELETAL:  No edema; No deformity  SKIN: Warm and dry NEUROLOGIC:  Alert and oriented x 3 PSYCHIATRIC:  Normal affect   ASSESSMENT:    No diagnosis found. PLAN:    Paroxysmal atrial fibrillation: Diagnosed during admission 02/2019 when found to have hyperparathyroidism and dehydration.  CHA2DS2-VASc score 4 (hypertension, age x2, diabetes).  Normal LV systolic function  on TTE -Continue Eliquis 5 mg twice daily.  -Continue diltiazem 180 mg CD daily.    LVH: Cardiac MRI on 06/10/2019 to work-up basal septal hypertrophy, showed mild focal basal septal hypertrophy (14 mm basal anterior septum), not meeting criteria for HCM.  Normal biventricular function.  No LGE.  Consistent with hypertensive heart disease.  Hypertension: On diltiazem 180 mg daily  RTC in ***  Medication Adjustments/Labs and Tests Ordered: Current medicines are reviewed at length with the patient today.  Concerns regarding medicines are outlined above.  No orders of the defined types were placed in this encounter.  No orders of the defined types were placed in this encounter.   There are no Patient Instructions on file for this visit.   Signed, Donato Heinz, MD  01/24/2020 3:15 PM    [place]

## 2020-01-25 ENCOUNTER — Ambulatory Visit: Payer: Medicare HMO | Admitting: Cardiology

## 2020-02-14 NOTE — Progress Notes (Deleted)
Cardiology Office Note:    Date:  02/14/2020   ID:  Kalee, Mcclenathan 16-Dec-1940, MRN [id number]  PCP:  Willey Blade, MD  Cardiologist:  Donato Heinz, MD  Electrophysiologist:  None   Referring MD: Willey Blade, MD   No chief complaint on file.   History of Present Illness:    Martin Richards is a 80 y.o. male with a hx of diabetes, hypertension, CKD, hyperparathyroidism status post parathyroidectomy 05/2019 who presents for follow-up.  He was admitted to [facility] in February 2021 with dehydration and orthostatic hypotension.  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  Labs in the ED notable for AKI with creatinine 2.5, along with hypercalcemia and transaminitis.  Improved with IV fluids.  Work-up of his hypercalcemia showed elevated PTH.  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  He was started on cinacalcet and will follow up with endocrinology/general surgery as outpatient.  During admission, he went into atrial fibrillation with RVR.  This was a new diagnosis.  Rates were initially up to the 140s, improved with diltiazem drip.  He subsequently converted back to normal sinus rhythm, with rate in 60s.  He was started on Eliquis 5 mg twice daily and converted from diltiazem drip to p.o. diltiazem CD 120 mg daily.  TTE was notable for EF 65 to 70%, basal septal hypertrophy.  Cardiac MRI was planned as an outpatient to evaluate etiology (amyloidosis versus HCM versus hypertensive heart disease).  He had heme positive stool and underwent EGD/ colonoscopy.  EGD showed gastritis.  Colonoscopy showed multiple polyps, which were removed.  After discharge from the hospital, he was readmitted in [facility] shortly after discharge with a GI bleed.  Reports that Eliquis was stopped, he received multiple transfusions, and GI bleeding resolved.  He did not undergo repeat EGD/colonoscopy.  He saw gastroenterology  and Eliquis restarted.  Reports that GI felt that bleed was secondary to starting Eliquis too soon after polyps were removed.  He reported no further bleeding since restarting Eliquis.    Cardiac MRI on 06/10/2019 to work-up basal septal hypertrophy, showed mild focal basal septal hypertrophy (14 mm basal anterior septum), not meeting criteria for HCM.  Normal biventricular function.  No LGE.  Since last clinic visit,   he underwent parathyroidectomy on 06/18/2019.  Reports he has been doing well.  He denies any chest pain, dyspnea, palpitations, lightheadedness, or syncope.  States that his lower extremity edema has resolved.  He has been taking Eliquis, denies any bleeding issues.    BP Readings from Last 3 Encounters:  07/29/19 140/83  07/20/19 (!) 150/74  07/18/19 (!) 162/82      Past Medical History:  Diagnosis Date  . Diabetes mellitus   . Dysrhythmia    Hx. AFIB  . Gout   . Hypertension     Past Surgical History:  Procedure Laterality Date  . BIOPSY  03/06/2019   Procedure: BIOPSY;  Surgeon: Carol Ada, MD;  Location: [place];  Service: Endoscopy;;  . CHOLECYSTECTOMY    . COLONOSCOPY WITH PROPOFOL N/A 03/06/2019   Procedure: COLONOSCOPY WITH PROPOFOL;  Surgeon: Carol Ada, MD;  Location: [place];  Service: Endoscopy;  Laterality: N/A;  . ESOPHAGOGASTRODUODENOSCOPY (EGD) WITH PROPOFOL N/A 03/06/2019   Procedure: ESOPHAGOGASTRODUODENOSCOPY (EGD) WITH PROPOFOL;  Surgeon: Carol Ada, MD;  Location: [place];  Service: Endoscopy;  Laterality: N/A;  . HEMOSTASIS CLIP PLACEMENT  03/06/2019   Procedure: HEMOSTASIS CLIP  PLACEMENT;  Surgeon: Carol Ada, MD;  Location: [place];  Service: Endoscopy;;  . PARATHYROIDECTOMY N/A 06/18/2019   Procedure: NECK EXPLORATION AND LEFT INFERIOR PARATHYROIDECTOMY;  Surgeon: Armandina Gemma, MD;  Location: [facility];  Service: General;  Laterality: N/A;  . POLYPECTOMY  03/06/2019   Procedure: POLYPECTOMY;  Surgeon: Carol Ada, MD;  Location: [facility] ENDOSCOPY;  Service: Endoscopy;;  . SUBMUCOSAL LIFTING INJECTION  03/06/2019   Procedure: SUBMUCOSAL LIFTING INJECTION;  Surgeon: Carol Ada, MD;  Location: [place];  Service: Endoscopy;;    Current Medications: No outpatient medications have been marked as taking for the 02/17/20 encounter (Appointment) with Donato Heinz, MD.     Allergies:   Patient has no known allergies.   Social History   Socioeconomic History  . Marital status: Married    Spouse name: Not on file  . Number of children: Not on file  . Years of education: Not on file  . Highest education level: Not on file  Occupational History  . Not on file  Tobacco Use  . Smoking status: Never Smoker  . Smokeless tobacco: Never Used  Vaping Use  . Vaping Use: Never used  Substance and Sexual Activity  . Alcohol use: Yes    Comment: rare  . Drug use: No  . Sexual activity: Not on file  Other Topics Concern  . Not on file  Social History Narrative  . Not on file   Social Determinants of Health   Financial Resource Strain: Not on file  Food Insecurity: Not on file  Transportation Needs: Not on file  Physical Activity: Not on file  Stress: Not on file  Social Connections: Not on file     Family History: The patient's family history includes Cancer in his sister; Diabetes in his brother; Hypertension in his brother, father, and mother.  ROS:   Please see the history of present illness.     All other systems reviewed and are negative.  EKGs/Labs/Other Studies Reviewed:    The following studies were reviewed today:   EKG:  EKG is  ordered today.  The ekg ordered today demonstrates normal sinus rhythm with sinus arrhythmia, rate 77 , first-degree AV block, no ST abnormalities  TTE 03/04/19: 1. Left ventricular ejection fraction, by estimation, is 65 to 70%. The  left ventricle has normal function. The left ventrical has no regional  wall motion abnormalities.  There is moderately increased left ventricular  hypertrophy of the basal-septal  segment. Left ventricular diastolic function could not be evaluated  secondary to atrial fibrillation.  2. Right ventricular systolic function is normal. The right ventricular  size is normal. Tricuspid regurgitation signal is inadequate for assessing  PA pressure.  3. The mitral valve is normal in structure and function. trivial mitral  valve regurgitation. No evidence of mitral stenosis.  4. The aortic valve is tricuspid. Aortic valve regurgitation is not  visualized. Mild aortic valve sclerosis is present, with no evidence of  aortic valve stenosis.  5. The inferior vena cava is dilated in size with <50% respiratory  variability, suggesting right atrial pressure of 15 mmHg.   Recent Labs: 03/03/2019: TSH 1.342 03/08/2019: Magnesium 1.4 07/18/2019: ALT 32; BUN 18; Creatinine, Ser 1.41; Hemoglobin 10.5; Platelets 285; Potassium 4.0; Sodium 138  Recent Lipid Panel No results found for: CHOL, TRIG, HDL, CHOLHDL, VLDL, LDLCALC, LDLDIRECT  Physical Exam:    VS:  There were no vitals taken for this visit.    Wt Readings from Last 3 Encounters:  07/29/19 198 lb 3.2 oz (89.9 kg)  07/19/19 196 lb 12.8 oz (89.3 kg)  07/18/19 205 lb (93 kg)     GEN:  Well nourished, well developed in no acute distress HEENT: Normal NECK: No JVD; No carotid bruits LYMPHATICS: No lymphadenopathy CARDIAC: RRR, no murmurs, rubs, gallops RESPIRATORY:  Clear to auscultation without rales, wheezing or rhonchi  ABDOMEN: Soft, non-tender, non-distended MUSCULOSKELETAL:  No edema; No deformity  SKIN: Warm and dry NEUROLOGIC:  Alert and oriented x 3 PSYCHIATRIC:  Normal affect   ASSESSMENT:    No diagnosis found. PLAN:    Paroxysmal atrial fibrillation: Diagnosed during admission 02/2019 when found to have hyperparathyroidism and dehydration.  CHA2DS2-VASc score 4 (hypertension, age x2, diabetes).  Normal LV systolic function  on TTE -Continue Eliquis 5 mg twice daily.  -Continue diltiazem 180 mg CD daily.    LVH: Cardiac MRI on 06/10/2019 to work-up basal septal hypertrophy, showed mild focal basal septal hypertrophy (14 mm basal anterior septum), not meeting criteria for HCM.  Normal biventricular function.  No LGE.  Consistent with hypertensive heart disease.  Hypertension: On diltiazem 180 mg daily  RTC in ***  Medication Adjustments/Labs and Tests Ordered: Current medicines are reviewed at length with the patient today.  Concerns regarding medicines are outlined above.  No orders of the defined types were placed in this encounter.  No orders of the defined types were placed in this encounter.   There are no Patient Instructions on file for this visit.   Signed, Donato Heinz, MD  02/14/2020 2:29 PM    [place]

## 2020-02-17 ENCOUNTER — Other Ambulatory Visit: Payer: Self-pay

## 2020-02-17 ENCOUNTER — Ambulatory Visit: Payer: Medicare HMO | Admitting: Cardiology

## 2020-02-17 ENCOUNTER — Telehealth: Payer: Self-pay | Admitting: Cardiology

## 2020-02-17 NOTE — Telephone Encounter (Signed)
Spoke with patient to reschedule his appointment from 02/17/20 with Dr. Idamae Lusher was a late arrival and had to reschedule--New appointment scheduled 05/23/20 at 3:00pm ---will mail information to patient and he voiced his understanding.

## 2020-05-22 NOTE — Progress Notes (Deleted)
Cardiology Office Note:    Date:  05/23/2020   ID:  Martin Richards Mar 30, 1940, MRN [id number]  PCP:  Willey Blade, MD  Cardiologist:  Donato Heinz, MD  Electrophysiologist:  None   Referring MD: Willey Blade, MD   No chief complaint on file.   History of Present Illness:    Martin Richards is a 80 y.o. male with a hx of diabetes, hypertension, CKD who presents for follow-up.  He was admitted to [facility] in February 2021 with dehydration and orthostatic hypotension.  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  Labs in the ED notable for AKI with creatinine 2.5, along with hypercalcemia and transaminitis.  Improved with IV fluids.  Work-up of his hypercalcemia showed elevated PTH.  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  He was started on cinacalcet and will follow up with endocrinology/general surgery as outpatient.  During admission, he went into atrial fibrillation with RVR.  This was a new diagnosis.  Rates were initially up to the 140s, improved with diltiazem drip.  He subsequently converted back to normal sinus rhythm, with rate in 60s.  He was started on Eliquis 5 mg twice daily and converted from diltiazem drip to p.o. diltiazem CD 120 mg daily.  TTE was notable for EF 65 to 70%, basal septal hypertrophy.  Cardiac MRI was planned as an outpatient to evaluate etiology (amyloidosis versus HCM versus hypertensive heart disease).  He had heme positive stool and underwent EGD/ colonoscopy.  EGD showed gastritis.  Colonoscopy showed multiple polyps, which were removed.  After discharge from the hospital, he was readmitted in [facility] shortly after discharge with a GI bleed.  Reports that Eliquis was stopped, he received multiple transfusions, and GI bleeding resolved.  He did not undergo repeat EGD/colonoscopy.  He saw gastroenterology and Eliquis was restarted.  Reports that GI felt that bleed  was secondary to starting Eliquis too soon after polyps were removed.  He reported no further bleeding since restarting Eliquis.    Cardiac MRI on 06/10/2019 to work-up basal septal hypertrophy, showed mild focal basal septal hypertrophy (14 mm basal anterior septum), not meeting criteria for HCM.  Normal biventricular function.  No LGE.  Since last clinic visit,  he underwent parathyroidectomy on 06/18/2019.  Reports he has been doing well.  He denies any chest pain, dyspnea, palpitations, lightheadedness, or syncope.  States that his lower extremity edema has resolved.  He has been taking Eliquis, denies any bleeding issues.    BP Readings from Last 3 Encounters:  05/23/20 (!) 166/80  07/29/19 140/83  07/20/19 (!) 150/74      Past Medical History:  Diagnosis Date  . Diabetes mellitus   . Dysrhythmia    Hx. AFIB  . Gout   . Hypertension     Past Surgical History:  Procedure Laterality Date  . BIOPSY  03/06/2019   Procedure: BIOPSY;  Surgeon: Carol Ada, MD;  Location: [place];  Service: Endoscopy;;  . CHOLECYSTECTOMY    . COLONOSCOPY WITH PROPOFOL N/A 03/06/2019   Procedure: COLONOSCOPY WITH PROPOFOL;  Surgeon: Carol Ada, MD;  Location: [place];  Service: Endoscopy;  Laterality: N/A;  . ESOPHAGOGASTRODUODENOSCOPY (EGD) WITH PROPOFOL N/A 03/06/2019   Procedure: ESOPHAGOGASTRODUODENOSCOPY (EGD) WITH PROPOFOL;  Surgeon: Carol Ada, MD;  Location: [place];  Service: Endoscopy;  Laterality: N/A;  . HEMOSTASIS CLIP PLACEMENT  03/06/2019   Procedure: HEMOSTASIS CLIP PLACEMENT;  Surgeon: Carol Ada,  MD;  Location: [place];  Service: Endoscopy;;  . PARATHYROIDECTOMY N/A 06/18/2019   Procedure: NECK EXPLORATION AND LEFT INFERIOR PARATHYROIDECTOMY;  Surgeon: Armandina Gemma, MD;  Location: [facility];  Service: General;  Laterality: N/A;  . POLYPECTOMY  03/06/2019   Procedure: POLYPECTOMY;  Surgeon: Carol Ada, MD;  Location: [facility] ENDOSCOPY;  Service: Endoscopy;;  .  SUBMUCOSAL LIFTING INJECTION  03/06/2019   Procedure: SUBMUCOSAL LIFTING INJECTION;  Surgeon: Carol Ada, MD;  Location: [place];  Service: Endoscopy;;    Current Medications: Current Meds  Medication Sig  . allopurinol (ZYLOPRIM) 100 MG tablet Take 100 mg by mouth daily.  Marland Kitchen amLODipine (NORVASC) 5 MG tablet Take 5 mg by mouth daily.  Marland Kitchen apixaban (ELIQUIS) 5 MG TABS tablet Take 1 tablet (5 mg total) by mouth 2 (two) times daily.  Marland Kitchen atorvastatin (LIPITOR) 40 MG tablet Take 40 mg by mouth daily.  . Blood Glucose Monitoring Suppl (TRUE METRIX METER) w/Device KIT   . calcitRIOL (ROCALTROL) 0.5 MCG capsule Take 0.5 mcg by mouth daily.  . cholecalciferol (VITAMIN D) 25 MCG tablet Take 1 tablet (1,000 Units total) by mouth daily.  . colchicine 0.6 MG tablet Take 0.6-1.2 mg by mouth 2 (two) times daily as needed (gout).   Marland Kitchen diltiazem (CARDIZEM CD) 180 MG 24 hr capsule Take 1 capsule (180 mg total) by mouth daily.  Marland Kitchen FeFum-FePoly-FA-B Cmp-C-Biot (INTEGRA PLUS) CAPS Take 1 capsule by mouth daily.   . finasteride (PROSCAR) 5 MG tablet Take 5 mg by mouth daily.   . furosemide (LASIX) 40 MG tablet Take 40 mg by mouth 2 (two) times daily.  Marland Kitchen liraglutide (VICTOZA) 18 MG/3ML SOPN Victoza 3-Pak 0.6 mg/0.1 mL (18 mg/3 mL) subcutaneous pen injector  INJECT 1.8 MG UNDER THE SKIN ONCE DAILY  . magnesium oxide (MAG-OX) 400 (241.3 Mg) MG tablet Take 2 tablets (800 mg total) by mouth daily.  . Multiple Vitamin (MULITIVITAMIN WITH MINERALS) TABS Take 1 tablet by mouth daily.  . pantoprazole (PROTONIX) 40 MG tablet Take 1 tablet (40 mg total) by mouth daily.  . phosphorus (K PHOS NEUTRAL) 155-852-130 MG tablet Take 1 tablet (250 mg total) by mouth 3 (three) times daily.  . potassium chloride SA (KLOR-CON) 20 MEQ tablet Take 20 mEq by mouth daily.  . tamsulosin (FLOMAX) 0.4 MG CAPS capsule tamsulosin 0.4 mg capsule  TAKE 1 CAPSULE BY MOUTH ONCE DAILY  . traMADol (ULTRAM) 50 MG tablet Take 1-2 tablets (50-100  mg total) by mouth every 6 (six) hours as needed.     Allergies:   Patient has no known allergies.   Social History   Socioeconomic History  . Marital status: Married    Spouse name: Not on file  . Number of children: Not on file  . Years of education: Not on file  . Highest education level: Not on file  Occupational History  . Not on file  Tobacco Use  . Smoking status: Never Smoker  . Smokeless tobacco: Never Used  Vaping Use  . Vaping Use: Never used  Substance and Sexual Activity  . Alcohol use: Yes    Comment: rare  . Drug use: No  . Sexual activity: Not on file  Other Topics Concern  . Not on file  Social History Narrative  . Not on file   Social Determinants of Health   Financial Resource Strain: Not on file  Food Insecurity: Not on file  Transportation Needs: Not on file  Physical Activity: Not on file  Stress: Not  on file  Social Connections: Not on file     Family History: The patient's family history includes Cancer in his sister; Diabetes in his brother; Hypertension in his brother, father, and mother.  ROS:   Please see the history of present illness.     All other systems reviewed and are negative.  EKGs/Labs/Other Studies Reviewed:    The following studies were reviewed today:   EKG:  EKG is  ordered today.  The ekg ordered today demonstrates normal sinus rhythm with sinus arrhythmia, rate 77 , first-degree AV block, no ST abnormalities  TTE 03/04/19: 1. Left ventricular ejection fraction, by estimation, is 65 to 70%. The  left ventricle has normal function. The left ventrical has no regional  wall motion abnormalities. There is moderately increased left ventricular  hypertrophy of the basal-septal  segment. Left ventricular diastolic function could not be evaluated  secondary to atrial fibrillation.  2. Right ventricular systolic function is normal. The right ventricular  size is normal. Tricuspid regurgitation signal is inadequate for  assessing  PA pressure.  3. The mitral valve is normal in structure and function. trivial mitral  valve regurgitation. No evidence of mitral stenosis.  4. The aortic valve is tricuspid. Aortic valve regurgitation is not  visualized. Mild aortic valve sclerosis is present, with no evidence of  aortic valve stenosis.  5. The inferior vena cava is dilated in size with <50% respiratory  variability, suggesting right atrial pressure of 15 mmHg.   Recent Labs: 07/18/2019: ALT 32; BUN 18; Creatinine, Ser 1.41; Hemoglobin 10.5; Platelets 285; Potassium 4.0; Sodium 138  Recent Lipid Panel No results found for: CHOL, TRIG, HDL, CHOLHDL, VLDL, LDLCALC, LDLDIRECT  Physical Exam:    VS:  BP (!) 166/80   Pulse 71   Ht 6' 2.5" (1.892 m)   Wt 222 lb 9.6 oz (101 kg)   SpO2 99%   BMI 28.20 kg/m     Wt Readings from Last 3 Encounters:  05/23/20 222 lb 9.6 oz (101 kg)  07/29/19 198 lb 3.2 oz (89.9 kg)  07/19/19 196 lb 12.8 oz (89.3 kg)     GEN:  Well nourished, well developed in no acute distress HEENT: Normal NECK: No JVD; No carotid bruits LYMPHATICS: No lymphadenopathy CARDIAC: RRR, no murmurs, rubs, gallops RESPIRATORY:  Clear to auscultation without rales, wheezing or rhonchi  ABDOMEN: Soft, non-tender, non-distended MUSCULOSKELETAL:  No edema; No deformity  SKIN: Warm and dry NEUROLOGIC:  Alert and oriented x 3 PSYCHIATRIC:  Normal affect   ASSESSMENT:    No diagnosis found. PLAN:    Paroxysmal atrial fibrillation: Diagnosed during admission 02/2019 when found to have hyperparathyroidism and dehydration.  CHA2DS2-VASc score 4 (hypertension, age x2, diabetes).  Normal LV systolic function on TTE -Continue Eliquis 5 mg twice daily.  -Continue diltiazem 180 mg daily.  LVH: Cardiac MRI on 06/10/2019 to work-up basal septal hypertrophy, showed mild focal basal septal hypertrophy (14 mm basal anterior septum), not meeting criteria for HCM.  Normal biventricular function.  No LGE.   Consistent with hypertensive heart disease.  Hypertension: On diltiazem 180 milligrams daily  RTC in ***  Medication Adjustments/Labs and Tests Ordered: Current medicines are reviewed at length with the patient today.  Concerns regarding medicines are outlined above.  No orders of the defined types were placed in this encounter.  No orders of the defined types were placed in this encounter.   There are no Patient Instructions on file for this visit.   Signed, Doreatha Martin  Gardiner Rhyme, MD  05/23/2020 3:43 PM    [facility]

## 2020-05-23 ENCOUNTER — Other Ambulatory Visit: Payer: Self-pay

## 2020-05-23 ENCOUNTER — Encounter: Payer: Self-pay | Admitting: Cardiology

## 2020-05-23 ENCOUNTER — Ambulatory Visit: Payer: Medicare HMO | Admitting: Cardiology

## 2020-05-23 VITALS — BP 166/80 | HR 71 | Ht 74.5 in | Wt 222.6 lb

## 2020-05-23 DIAGNOSIS — I517 Cardiomegaly: Secondary | ICD-10-CM

## 2020-05-23 DIAGNOSIS — I10 Essential (primary) hypertension: Secondary | ICD-10-CM | POA: Diagnosis not present

## 2020-05-23 DIAGNOSIS — I480 Paroxysmal atrial fibrillation: Secondary | ICD-10-CM

## 2020-05-23 MED ORDER — AMLODIPINE BESYLATE 5 MG PO TABS: 5.0000 mg | ORAL_TABLET | Freq: Every day | ORAL | 3 refills | Status: DC

## 2020-05-23 MED ORDER — CARVEDILOL 6.25 MG PO TABS: 6.2500 mg | ORAL_TABLET | Freq: Two times a day (BID) | ORAL | 3 refills | Status: DC

## 2020-05-23 NOTE — Progress Notes (Signed)
Cardiology Office Note:    Date:  05/23/2020   ID:  [name], DOB 02/04/1940, MRN [id number]  PCP:  Willey Blade, MD  Cardiologist:  Donato Heinz, MD  Electrophysiologist:  None   Referring MD: Willey Blade, MD   Chief Complaint  Patient presents with  . Atrial Fibrillation    History of Present Illness:    Martin Richards is a 80 y.o. male with a hx of diabetes, hypertension, CKD who presents for follow-up.  He was admitted to [facility] in February 2021 with dehydration and orthostatic hypotension.  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  Labs in the ED notable for AKI with creatinine 2.5, along with hypercalcemia and transaminitis.  Improved with IV fluids.  Work-up of his hypercalcemia showed elevated PTH.  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  He was started on cinacalcet and will follow up with endocrinology/general surgery as outpatient.  During admission, he went into atrial fibrillation with RVR.  This was a new diagnosis.  Rates were initially up to the 140s, improved with diltiazem drip.  He subsequently converted back to normal sinus rhythm, with rate in 60s.  He was started on Eliquis 5 mg twice daily and converted from diltiazem drip to p.o. diltiazem CD 120 mg daily.  TTE was notable for EF 65 to 70%, basal septal hypertrophy.  Cardiac MRI was planned as an outpatient to evaluate etiology (amyloidosis versus HCM versus hypertensive heart disease).  He had heme positive stool and underwent EGD/ colonoscopy.  EGD showed gastritis.  Colonoscopy showed multiple polyps, which were removed.  After discharge from the hospital, he was readmitted in [facility] shortly after discharge with a GI bleed.  Reports that Eliquis was stopped, he received multiple transfusions, and GI bleeding resolved.  He did not undergo repeat EGD/colonoscopy.  He saw gastroenterology and Eliquis was  restarted.  Reports that GI felt that bleed was secondary to starting Eliquis too soon after polyps were removed.  He reported no further bleeding since restarting Eliquis.    Cardiac MRI on 06/10/2019 to work-up basal septal hypertrophy, showed mild focal basal septal hypertrophy (14 mm basal anterior septum), not meeting criteria for HCM.  Normal biventricular function.  No LGE.  Since last clinic visit, reports he has been doing well. His blood pressure at home has been around 160/70 he reports, and is currently taking diltiazem. He has occasionally been taking Lasix, but not frequently as he is still working. After eating large meals he has some shortness of breath, which he attributes to being a diabetic. He does not believe he has gone back into Afib. He denies any chest pain, palpitations, lightheadedness, or syncope. Denies any bleeding issues in his stool or urine.    BP Readings from Last 3 Encounters:  05/23/20 (!) 166/80  07/29/19 140/83  07/20/19 (!) 150/74      Past Medical History:  Diagnosis Date  . Diabetes mellitus   . Dysrhythmia    Hx. AFIB  . Gout   . Hypertension     Past Surgical History:  Procedure Laterality Date  . BIOPSY  03/06/2019   Procedure: BIOPSY;  Surgeon: Carol Ada, MD;  Location: [place];  Service: Endoscopy;;  . CHOLECYSTECTOMY    . COLONOSCOPY WITH PROPOFOL N/A 03/06/2019   Procedure: COLONOSCOPY WITH PROPOFOL;  Surgeon: Carol Ada, MD;  Location: [place];  Service: Endoscopy;  Laterality: N/A;  . ESOPHAGOGASTRODUODENOSCOPY (  EGD) WITH PROPOFOL N/A 03/06/2019   Procedure: ESOPHAGOGASTRODUODENOSCOPY (EGD) WITH PROPOFOL;  Surgeon: Carol Ada, MD;  Location: [place];  Service: Endoscopy;  Laterality: N/A;  . HEMOSTASIS CLIP PLACEMENT  03/06/2019   Procedure: HEMOSTASIS CLIP PLACEMENT;  Surgeon: Carol Ada, MD;  Location: [place];  Service: Endoscopy;;  . PARATHYROIDECTOMY N/A 06/18/2019   Procedure: NECK EXPLORATION AND  LEFT INFERIOR PARATHYROIDECTOMY;  Surgeon: Armandina Gemma, MD;  Location: [facility];  Service: General;  Laterality: N/A;  . POLYPECTOMY  03/06/2019   Procedure: POLYPECTOMY;  Surgeon: Carol Ada, MD;  Location: [facility] ENDOSCOPY;  Service: Endoscopy;;  . SUBMUCOSAL LIFTING INJECTION  03/06/2019   Procedure: SUBMUCOSAL LIFTING INJECTION;  Surgeon: Carol Ada, MD;  Location: [place];  Service: Endoscopy;;    Current Medications: Current Meds  Medication Sig  . allopurinol (ZYLOPRIM) 100 MG tablet Take 100 mg by mouth daily.  Marland Kitchen apixaban (ELIQUIS) 5 MG TABS tablet Take 1 tablet (5 mg total) by mouth 2 (two) times daily.  Marland Kitchen atorvastatin (LIPITOR) 40 MG tablet Take 40 mg by mouth daily.  . Blood Glucose Monitoring Suppl (TRUE METRIX METER) w/Device KIT   . calcitRIOL (ROCALTROL) 0.5 MCG capsule Take 0.5 mcg by mouth daily.  . carvedilol (COREG) 6.25 MG tablet Take 1 tablet (6.25 mg total) by mouth 2 (two) times daily.  . cholecalciferol (VITAMIN D) 25 MCG tablet Take 1 tablet (1,000 Units total) by mouth daily.  . colchicine 0.6 MG tablet Take 0.6-1.2 mg by mouth 2 (two) times daily as needed (gout).   . FeFum-FePoly-FA-B Cmp-C-Biot (INTEGRA PLUS) CAPS Take 1 capsule by mouth daily.   . finasteride (PROSCAR) 5 MG tablet Take 5 mg by mouth daily.   . furosemide (LASIX) 40 MG tablet Take 40 mg by mouth 2 (two) times daily.  Marland Kitchen liraglutide (VICTOZA) 18 MG/3ML SOPN Victoza 3-Pak 0.6 mg/0.1 mL (18 mg/3 mL) subcutaneous pen injector  INJECT 1.8 MG UNDER THE SKIN ONCE DAILY  . magnesium oxide (MAG-OX) 400 (241.3 Mg) MG tablet Take 2 tablets (800 mg total) by mouth daily.  . Multiple Vitamin (MULITIVITAMIN WITH MINERALS) TABS Take 1 tablet by mouth daily.  . pantoprazole (PROTONIX) 40 MG tablet Take 1 tablet (40 mg total) by mouth daily.  . phosphorus (K PHOS NEUTRAL) 155-852-130 MG tablet Take 1 tablet (250 mg total) by mouth 3 (three) times daily.  . potassium chloride SA (KLOR-CON) 20 MEQ tablet Take  20 mEq by mouth daily.  . tamsulosin (FLOMAX) 0.4 MG CAPS capsule tamsulosin 0.4 mg capsule  TAKE 1 CAPSULE BY MOUTH ONCE DAILY  . traMADol (ULTRAM) 50 MG tablet Take 1-2 tablets (50-100 mg total) by mouth every 6 (six) hours as needed.  . [DISCONTINUED] amLODipine (NORVASC) 5 MG tablet Take 5 mg by mouth daily.  . [DISCONTINUED] diltiazem (CARDIZEM CD) 180 MG 24 hr capsule Take 1 capsule (180 mg total) by mouth daily.     Allergies:   Patient has no known allergies.   Social History   Socioeconomic History  . Marital status: Married    Spouse name: Not on file  . Number of children: Not on file  . Years of education: Not on file  . Highest education level: Not on file  Occupational History  . Not on file  Tobacco Use  . Smoking status: Never Smoker  . Smokeless tobacco: Never Used  Vaping Use  . Vaping Use: Never used  Substance and Sexual Activity  . Alcohol use: Yes    Comment: rare  .  Drug use: No  . Sexual activity: Not on file  Other Topics Concern  . Not on file  Social History Narrative  . Not on file   Social Determinants of Health   Financial Resource Strain: Not on file  Food Insecurity: Not on file  Transportation Needs: Not on file  Physical Activity: Not on file  Stress: Not on file  Social Connections: Not on file     Family History: The patient's family history includes Cancer in his sister; Diabetes in his brother; Hypertension in his brother, father, and mother.  ROS:   Please see the history of present illness.    (+) Shortness of breath All other systems reviewed and are negative.  EKGs/Labs/Other Studies Reviewed:    The following studies were reviewed today:   EKG:   07/29/2019: normal sinus rhythm with sinus arrhythmia, rate 77 , first-degree AV block, no ST abnormalities 05/23/2020: normal sinus rhythm with PACs, rate 71 bpm, first-degree AV block, non-specific T wave finding  TTE 03/04/19: 1. Left ventricular ejection fraction, by  estimation, is 65 to 70%. The  left ventricle has normal function. The left ventrical has no regional  wall motion abnormalities. There is moderately increased left ventricular  hypertrophy of the basal-septal  segment. Left ventricular diastolic function could not be evaluated  secondary to atrial fibrillation.  2. Right ventricular systolic function is normal. The right ventricular  size is normal. Tricuspid regurgitation signal is inadequate for assessing  PA pressure.  3. The mitral valve is normal in structure and function. trivial mitral  valve regurgitation. No evidence of mitral stenosis.  4. The aortic valve is tricuspid. Aortic valve regurgitation is not  visualized. Mild aortic valve sclerosis is present, with no evidence of  aortic valve stenosis.  5. The inferior vena cava is dilated in size with <50% respiratory  variability, suggesting right atrial pressure of 15 mmHg.   Recent Labs: 07/18/2019: ALT 32; BUN 18; Creatinine, Ser 1.41; Hemoglobin 10.5; Platelets 285; Potassium 4.0; Sodium 138  Recent Lipid Panel No results found for: CHOL, TRIG, HDL, CHOLHDL, VLDL, LDLCALC, LDLDIRECT  Physical Exam:    VS:  BP (!) 166/80   Pulse 71   Ht 6' 2.5" (1.892 m)   Wt 222 lb 9.6 oz (101 kg)   SpO2 99%   BMI 28.20 kg/m     Wt Readings from Last 3 Encounters:  05/23/20 222 lb 9.6 oz (101 kg)  07/29/19 198 lb 3.2 oz (89.9 kg)  07/19/19 196 lb 12.8 oz (89.3 kg)     GEN:  Well nourished, well developed in no acute distress HEENT: Normal NECK: No JVD; No carotid bruits LYMPHATICS: No lymphadenopathy CARDIAC: RRR, no murmurs, rubs, gallops RESPIRATORY:  Clear to auscultation without rales, wheezing or rhonchi  ABDOMEN: Soft, non-tender, non-distended MUSCULOSKELETAL:  Trace bilateral LE edema; No deformity  SKIN: Warm and dry NEUROLOGIC:  Alert and oriented x 3 PSYCHIATRIC:  Normal affect   ASSESSMENT:    1. Paroxysmal atrial fibrillation (HCC)   2. LVH (left  ventricular hypertrophy)   3. Essential hypertension    PLAN:    Paroxysmal atrial fibrillation: Diagnosed during admission 02/2019 when found to have hyperparathyroidism and dehydration.  CHA2DS2-VASc score 4 (hypertension, age x2, diabetes).  Normal LV systolic function on TTE -Continue Eliquis 5 mg twice daily.  -Will switch from diltiazem to carvedilol 6.25 mg twice daily given he has also been on amlodipine  LVH: Cardiac MRI on 06/10/2019 to work-up basal septal hypertrophy,  showed mild focal basal septal hypertrophy (14 mm basal anterior septum), not meeting criteria for HCM.  Normal biventricular function.  No LGE.  Consistent with hypertensive heart disease.  Hypertension: On diltiazem 180 mg daily.  Also has amlodipine 5 mg daily and Lasix 40 mg twice daily on his medication list, but unclear if he is taking these meds.  Unclear why he is on 2 calcium channel blockers.  We will switch from diltiazem to carvedilol 6.25 mg twice daily.  Asked patient to bring his medications with him to pharmacy hypertension clinic in 2 weeks so we can ensure he is taking medications appropriately.  Asked to check BP daily for the next 2 weeks and bring log to clinic appointment  RTC in 6 months.  Medication Adjustments/Labs and Tests Ordered: Current medicines are reviewed at length with the patient today.  Concerns regarding medicines are outlined above.  Orders Placed This Encounter  Procedures  . EKG 12-Lead   Meds ordered this encounter  Medications  . amLODipine (NORVASC) 5 MG tablet    Sig: Take 1 tablet (5 mg total) by mouth daily.    Dispense:  30 tablet    Refill:  3    Stop diltiazem  . carvedilol (COREG) 6.25 MG tablet    Sig: Take 1 tablet (6.25 mg total) by mouth 2 (two) times daily.    Dispense:  60 tablet    Refill:  3    Patient Instructions  Medication Instructions:  STOP diltiazem START amlodipine 5 mg daily (if not already taking) START carvedilol (Coreg) 6.25 mg two  times daily  *If you need a refill on your cardiac medications before your next appointment, please call your pharmacy*  Follow-Up: At [facility], you and your health needs are our priority.  As part of our continuing mission to provide you with exceptional heart care, we have created designated Provider Care Teams.  These Care Teams include your primary Cardiologist (physician) and Advanced Practice Providers (APPs -  Physician Assistants and Nurse Practitioners) who all work together to provide you with the care you need, when you need it.  We recommend signing up for the patient portal called "MyChart".  Sign up information is provided on this After Visit Summary.  MyChart is used to connect with patients for Virtual Visits (Telemedicine).  Patients are able to view lab/test results, encounter notes, upcoming appointments, etc.  Non-urgent messages can be sent to your provider as well.   To learn more about what you can do with MyChart, go to [email or web address].    Your next appointment:   2 weeks with pharmacist (BP management) --BRING ALL MEDICATION AND BLOOD PRESSURE CUFF + LOG  6 months with Dr. Gardiner Rhyme  Other Instructions Please check your blood pressure at home daily, write it down.  Call the office or send message via Mychart with the readings in 2 weeks for Dr. Gardiner Rhyme to review.       I,Mathew Stumpf,acting as a Education administrator for Donato Heinz, MD.,have documented all relevant documentation on the behalf of Donato Heinz, MD,as directed by  Donato Heinz, MD while in the presence of Donato Heinz, MD.  I, Donato Heinz, MD, have reviewed all documentation for this visit. The documentation on 05/23/20 for the exam, diagnosis, procedures, and orders are all accurate and complete.    Signed, Donato Heinz, MD  05/23/2020 3:53 PM    [place]

## 2020-05-23 NOTE — Patient Instructions (Signed)
Medication Instructions:  STOP diltiazem START amlodipine 5 mg daily (if not already taking) START carvedilol (Coreg) 6.25 mg two times daily  *If you need a refill on your cardiac medications before your next appointment, please call your pharmacy*  Follow-Up: At [facility], you and your health needs are our priority.  As part of our continuing mission to provide you with exceptional heart care, we have created designated Provider Care Teams.  These Care Teams include your primary Cardiologist (physician) and Advanced Practice Providers (APPs -  Physician Assistants and Nurse Practitioners) who all work together to provide you with the care you need, when you need it.  We recommend signing up for the patient portal called "MyChart".  Sign up information is provided on this After Visit Summary.  MyChart is used to connect with patients for Virtual Visits (Telemedicine).  Patients are able to view lab/test results, encounter notes, upcoming appointments, etc.  Non-urgent messages can be sent to your provider as well.   To learn more about what you can do with MyChart, go to [email or web address].    Your next appointment:   2 weeks with pharmacist (BP management) --BRING ALL MEDICATION AND BLOOD PRESSURE CUFF + LOG  6 months with Dr. Gardiner Rhyme  Other Instructions Please check your blood pressure at home daily, write it down.  Call the office or send message via Mychart with the readings in 2 weeks for Dr. Gardiner Rhyme to review.

## 2020-06-09 ENCOUNTER — Other Ambulatory Visit: Payer: Self-pay

## 2020-06-09 ENCOUNTER — Encounter (INDEPENDENT_AMBULATORY_CARE_PROVIDER_SITE_OTHER): Payer: Self-pay

## 2020-06-09 ENCOUNTER — Ambulatory Visit (INDEPENDENT_AMBULATORY_CARE_PROVIDER_SITE_OTHER): Payer: Medicare HMO | Admitting: Pharmacist

## 2020-06-09 VITALS — BP 148/84 | HR 72

## 2020-06-09 DIAGNOSIS — I10 Essential (primary) hypertension: Secondary | ICD-10-CM

## 2020-06-09 MED ORDER — AMLODIPINE BESYLATE 10 MG PO TABS: 10.0000 mg | ORAL_TABLET | Freq: Every day | ORAL | 3 refills | Status: AC

## 2020-06-09 NOTE — Patient Instructions (Addendum)
It was nice to meet you today!  Your blood pressure goal is < 130/69mmHg  INCREASE your amlodipine from 5mg  to 10mg  once daily. You can double up and take 2 of your 5mg  tablets daily until you run out, then pick up the higher 10mg  dose and take 1 tablet daily  Continue taking your other medications  Try to increase your walking to 3-4 days a week  Try to swap out your [facility] lunch for a sandwich or salad a few days a week  Monitor and record your blood pressure at home - check your readings a few hours after taking your medications. I will call you in 3-4 weeks to see how your readings are looking at home

## 2020-06-09 NOTE — Progress Notes (Addendum)
Patient ID: Martin Richards                 DOB: 09/13/1940                      MRN: [id number]     HPI: Martin Richards is a 80 y.o. male referred by Dr. Gardiner Rhyme to HTN clinic. 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 Cardiac MRI 05/2019 showed mild focal basal septal hypertrophy. At last visit 05/23/20, BP was elevated at 166/80. Pt had been on 2 calcium channel blockers and his diltiazem was changed to carvedilol. He was advised to bring in all meds to his visit today.  Pt presents today in good spirits. He forgot to bring in his medications but has been checking his BP daily since his last visit and brings in a log. Typically checks his BP in the AM within 15 minutes of taking his morning meds. Denies dizziness, balance issues, headaches, and blurred vision, minimal swelling. No dizziness or balance issues, minimal swelling, no headaches or vision changes. Currently having painful gout flare - PCP sent in colchicine for him today. He also took 2 Aleve last night and today.  Current HTN meds:  Amlodipine 74m daily -9:30am Carvedilol 6.259mBID - 9:30am and 9pm Furosemide 4067mID  BP goal: <130/34m49m Family History: Cancer in his sister; Diabetes in his brother; Hypertension in his brother, father, and mother.  Social History: Denies tobacco, alcohol and drug use.  Diet: Breakfast - grits (does add salt), lunch - [employer] chicken pie, dinner - mac and cheese, sweet potatoes, beef/ribs etc. Drinks 16 oz unsweet tea, no coffee.  Exercise: Minimal. Will walk around the store for 30 minutes 2 days a week.  Home BP readings: Started higher 17/82, 159/75, 150/74, 146/69, 151/74, 145/72, 144/68, 149/69, 148/70. Starting on the 16th readings have improved - 133/62, 132/68, 136/65, 138/71. HR 60s-70s  Wt Readings from Last 3 Encounters:  05/23/20 222 lb 9.6 oz (101 kg)  07/29/19 198 lb 3.2 oz (89.9 kg)   07/19/19 196 lb 12.8 oz (89.3 kg)   BP Readings from Last 3 Encounters:  05/23/20 (!) 166/80  07/29/19 140/83  07/20/19 (!) 150/74   Pulse Readings from Last 3 Encounters:  05/23/20 71  07/29/19 77  07/20/19 74    Renal function: CrCl cannot be calculated (Patient's most recent lab result is older than the maximum 21 days allowed.).  Past Medical History:  Diagnosis Date  . Diabetes mellitus   . Dysrhythmia    Hx. AFIB  . Gout   . Hypertension     Current Outpatient Medications on File Prior to Visit  Medication Sig Dispense Refill  . allopurinol (ZYLOPRIM) 100 MG tablet Take 100 mg by mouth daily.    . amMarland KitchenODipine (NORVASC) 5 MG tablet Take 1 tablet (5 mg total) by mouth daily. 30 tablet 3  . apixaban (ELIQUIS) 5 MG TABS tablet Take 1 tablet (5 mg total) by mouth 2 (two) times daily. 60 tablet 1  . atorvastatin (LIPITOR) 40 MG tablet Take 40 mg by mouth daily.    . Blood Glucose Monitoring Suppl (TRUE METRIX METER) w/Device KIT     . calcitRIOL (ROCALTROL) 0.5 MCG capsule Take 0.5 mcg by mouth daily.    . carvedilol (COREG) 6.25 MG tablet Take 1 tablet (6.25 mg total) by mouth 2 (two) times daily. 60 tablet  3  . cholecalciferol (VITAMIN D) 25 MCG tablet Take 1 tablet (1,000 Units total) by mouth daily. 30 tablet 1  . colchicine 0.6 MG tablet Take 0.6-1.2 mg by mouth 2 (two) times daily as needed (gout).     . FeFum-FePoly-FA-B Cmp-C-Biot (INTEGRA PLUS) CAPS Take 1 capsule by mouth daily.     . finasteride (PROSCAR) 5 MG tablet Take 5 mg by mouth daily.     . furosemide (LASIX) 40 MG tablet Take 40 mg by mouth 2 (two) times daily.    Marland Kitchen liraglutide (VICTOZA) 18 MG/3ML SOPN Victoza 3-Pak 0.6 mg/0.1 mL (18 mg/3 mL) subcutaneous pen injector  INJECT 1.8 MG UNDER THE SKIN ONCE DAILY    . magnesium oxide (MAG-OX) 400 (241.3 Mg) MG tablet Take 2 tablets (800 mg total) by mouth daily. 30 tablet 2  . Multiple Vitamin (MULITIVITAMIN WITH MINERALS) TABS Take 1 tablet by mouth daily.     . pantoprazole (PROTONIX) 40 MG tablet Take 1 tablet (40 mg total) by mouth daily. 30 tablet 1  . phosphorus (K PHOS NEUTRAL) 155-852-130 MG tablet Take 1 tablet (250 mg total) by mouth 3 (three) times daily. 90 tablet 0  . potassium chloride SA (KLOR-CON) 20 MEQ tablet Take 20 mEq by mouth daily.    . tamsulosin (FLOMAX) 0.4 MG CAPS capsule tamsulosin 0.4 mg capsule  TAKE 1 CAPSULE BY MOUTH ONCE DAILY    . traMADol (ULTRAM) 50 MG tablet Take 1-2 tablets (50-100 mg total) by mouth every 6 (six) hours as needed. 15 tablet 0   No current facility-administered medications on file prior to visit.    No Known Allergies   Assessment/Plan:  1. Hypertension - BP has improved since changing diltiazem to carvedilol, however still remains elevated above goal <130/72mHg. Home readings have improved to the mid 1309Msystolic, clinic reading higher today likely secondary to gout flare and recent NSAID use. Since home readings have also remained consistently above goal, will increase amlodipine from 531mto 1057maily. Pt will continue on carvedilol 6.86m77mD. Encouraged pt to increase his walking and to swap out [employer] chicken pie that he eats for lunch daily with a lower sodium option like a salad or sandwich. Also provided pt with DASH diet handout. Will call pt in 3-4 weeks to follow up with home readings (he lives far from clinic and prefers phone call follow up vs in person). Advised him to check his BP 1-2 hours after taking AM meds instead of 15 mins after. If BP remains elevated at next follow up, will plan to add ARB (added benefit since pt also has CKD and DM).  Diann Bangerter E. Printice Hellmer, PharmD, BCACP, CPP [id number] [id number]: [phone]: [phone]/2022 3:36 PM

## 2020-06-29 ENCOUNTER — Telehealth: Payer: Self-pay | Admitting: Pharmacist

## 2020-06-29 NOTE — Telephone Encounter (Signed)
Called pt to follow up with BP readings since amlodipine was increased to 10mg  at his visit on 5/19 due to elevated BP in office and at home. Gout flare, NSAID use, and fast food intake were also contributing to elevated BP.   Pt reports resolution of gout flare about 2 days ago. BP had been 865-784 systolic prior to gout flare but increased to 135-145 during. Still taking Advil once every 3 days or so, reports Tylenol is ineffective. Noted mild swelling in his legs but this has improved since gout flare resolved as well. He reports adherence to amlodipine 10mg  daily, carvedilol 6.25mg  BID, and furosemide 40mg  BID. Has been eating less food from [facility].  Will continue current meds and advised pt to continue monitoring BP at home, limit NSAID use as much as possible, and focus on limiting salt intake. Will call pt in another 2 weeks to ensure readings are at goal. If they remain elevated, will plan to start ARB therapy for both BP and CKD benefit.

## 2020-07-13 ENCOUNTER — Telehealth: Payer: Self-pay | Admitting: Pharmacist

## 2020-07-13 NOTE — Telephone Encounter (Signed)
Left message for pt to follow up with home BP readings. If they remain elevated at home, will plan to start low dose ARB therapy for BP/DM/CKD benefit.

## 2020-07-15 MED ORDER — VALSARTAN 80 MG PO TABS: 80.0000 mg | ORAL_TABLET | Freq: Every day | ORAL | 5 refills | Status: DC

## 2020-07-15 NOTE — Telephone Encounter (Signed)
Called pt again to follow up with BP. Home readings 135-140/60s. Had another gout flare, resolved about a week ago. Has only seen 1 systolic BP < 195. Will add valsartan 80mg  daily for BP and CKD/DM benefit. Pt will take in the evening with 2nd carvedilol dose, and will continue other BP meds. Scheduled f/u in 2 weeks with me for BP check and BMET.

## 2020-08-02 ENCOUNTER — Ambulatory Visit: Payer: Medicare HMO

## 2020-08-03 ENCOUNTER — Ambulatory Visit (INDEPENDENT_AMBULATORY_CARE_PROVIDER_SITE_OTHER): Payer: Medicare HMO | Admitting: Pharmacist

## 2020-08-03 ENCOUNTER — Other Ambulatory Visit: Payer: Self-pay

## 2020-08-03 VITALS — BP 138/72 | HR 75

## 2020-08-03 DIAGNOSIS — I517 Cardiomegaly: Secondary | ICD-10-CM | POA: Diagnosis not present

## 2020-08-03 DIAGNOSIS — I10 Essential (primary) hypertension: Secondary | ICD-10-CM

## 2020-08-03 NOTE — Patient Instructions (Signed)
Your blood pressure is improving and is closer to goal < 130/10mmHg  We'll check your labs today and if they're stable, plan to increase your valsartan to 160mg  once daily  Continue taking your medications as you have been for now and I'll give you a call tomorrow with your lab results

## 2020-08-03 NOTE — Progress Notes (Signed)
Patient ID: Martin Richards                 DOB: 06-30-1940                      MRN: [id number]     HPI: Martin Richards is a 80 y.o. male referred by Dr. Gardiner Richards to HTN clinic. 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 Cardiac MRI 05/2019 showed mild focal basal septal hypertrophy. At last visit 05/23/20, BP was elevated at 166/80. Pt had been on 2 calcium channel blockers and his diltiazem was changed to carvedilol. At last visit with me on 06/09/20, BP was elevated at 148/84. Recent gout flare and NSAID use likely contributing, although home readings were consistently elevated as well so amlodipine was increased to 19m daily. He was also advised to increase walking and decrease sodium in diet. Home readings remained elevated and he was started on valsartan 859mdaily 07/13/20 for BP, CKD, and DM benefit. He presents today for follow up.  Pt presents today in good spirits. Reports tolerating medications well. Denies dizziness, balance issues, headaches, blurred vision, and swelling. Has not had another gout flare since last visit. Stopped taking NSAIDs. Did notice some blood in his stool - has been following with PCP for this who temporarily decreased his dose of Eliquis to 2.33m41mHas been dealing with nerve pain in his left leg, taking tramadol for this which has helped. Walking had been painful, trying to increase as able. Still eating out for lunch, has soup about 3x/week. Discussed high sodium content in this. Has 2 BP cuffs, one at home and one at his office. Office cuff typically measures 70m68mlower than home cuff, neither has been calibrated in the office before. BP this AM was 116/65 using office cuff, other readings typically 130-419-622tolic using home cuff.  Current HTN meds:  Amlodipine 70mg60mly -9:30am Carvedilol 6.233mg 56m- 9:30am and 9pm Valsartan 80mg d57m - 9pm Furosemide 40mg BI533m BP goal: <130/80mmHg  57mly History: Cancer in his sister; Diabetes in his brother; Hypertension in his brother, father, and mother.  Social History: Denies tobacco, alcohol and drug use.  Diet: Breakfast - grits (does add salt), lunch - [employer] chicken pie, dinner - mac and cheese, sweet potatoes, beef/ribs etc. Eating more fruit. Soup 3 days a week at lunch. Drinks 16 oz unsweet tea, no coffee.  Exercise: Minimal. Will walk around the store for 30 minutes 2 days a week.  Wt Readings from Last 3 Encounters:  05/23/20 222 lb 9.6 oz (101 kg)  07/29/19 198 lb 3.2 oz (89.9 kg)  07/19/19 196 lb 12.8 oz (89.3 kg)   BP Readings from Last 3 Encounters:  06/09/20 (!) 148/84  05/23/20 (!) 166/80  07/29/19 140/83   Pulse Readings from Last 3 Encounters:  06/09/20 72  05/23/20 71  07/29/19 77    Renal function: CrCl cannot be calculated (Patient's most recent lab result is older than the maximum 21 days allowed.).  Past Medical History:  Diagnosis Date   Diabetes mellitus    Dysrhythmia    Hx. AFIB   Gout    Hypertension     Current Outpatient Medications on File Prior to Visit  Medication Sig Dispense Refill   allopurinol (ZYLOPRIM) 100 MG tablet Take 100 mg by mouth daily.     amLODipine (NORVASC) 10 MG tablet  Take 1 tablet (10 mg total) by mouth daily. 90 tablet 3   apixaban (ELIQUIS) 5 MG TABS tablet Take 1 tablet (5 mg total) by mouth 2 (two) times daily. 60 tablet 1   atorvastatin (LIPITOR) 40 MG tablet Take 40 mg by mouth daily.     Blood Glucose Monitoring Suppl (TRUE METRIX METER) w/Device KIT      calcitRIOL (ROCALTROL) 0.5 MCG capsule Take 0.5 mcg by mouth daily.     carvedilol (COREG) 6.25 MG tablet Take 1 tablet (6.25 mg total) by mouth 2 (two) times daily. 60 tablet 3   cholecalciferol (VITAMIN D) 25 MCG tablet Take 1 tablet (1,000 Units total) by mouth daily. 30 tablet 1   colchicine 0.6 MG tablet Take 0.6-1.2 mg by mouth 2 (two) times daily as needed (gout).       FeFum-FePoly-FA-B Cmp-C-Biot (INTEGRA PLUS) CAPS Take 1 capsule by mouth daily.      finasteride (PROSCAR) 5 MG tablet Take 5 mg by mouth daily.      furosemide (LASIX) 40 MG tablet Take 40 mg by mouth 2 (two) times daily.     liraglutide (VICTOZA) 18 MG/3ML SOPN Victoza 3-Pak 0.6 mg/0.1 mL (18 mg/3 mL) subcutaneous pen injector  INJECT 1.8 MG UNDER THE SKIN ONCE DAILY     magnesium oxide (MAG-OX) 400 (241.3 Mg) MG tablet Take 2 tablets (800 mg total) by mouth daily. 30 tablet 2   Multiple Vitamin (MULITIVITAMIN WITH MINERALS) TABS Take 1 tablet by mouth daily.     pantoprazole (PROTONIX) 40 MG tablet Take 1 tablet (40 mg total) by mouth daily. 30 tablet 1   phosphorus (K PHOS NEUTRAL) 155-852-130 MG tablet Take 1 tablet (250 mg total) by mouth 3 (three) times daily. 90 tablet 0   potassium chloride SA (KLOR-CON) 20 MEQ tablet Take 20 mEq by mouth daily.     tamsulosin (FLOMAX) 0.4 MG CAPS capsule tamsulosin 0.4 mg capsule  TAKE 1 CAPSULE BY MOUTH ONCE DAILY     traMADol (ULTRAM) 50 MG tablet Take 1-2 tablets (50-100 mg total) by mouth every 6 (six) hours as needed. 15 tablet 0   valsartan (DIOVAN) 80 MG tablet Take 1 tablet (80 mg total) by mouth daily. 30 tablet 5   No current facility-administered medications on file prior to visit.    No Known Allergies   Assessment/Plan:  1. Hypertension - BP continues to improve notably and is closer to goal <130/17mHg, although most home readings and clinic reading with systolic BP in the 1016P Will check BMET today with recent valsartan start. If labs are stable, will increase valsartan to 1657mdaily. Continue amlodipine 1055maily, carvedilol 6.6m68mD, and furosemide 40mg85m. Encouraged pt to increase his walking as able and to decrease soup intake and replace with lower sodium options. Will call pt tomorrow with finalized med plan once labs result and will schedule f/u at that time. Will ask pt to bring both home cuffs to next visit to assess  discrepancy with systolic readings.  Martin Richards E. Martin Richards, PharmD, BCACP, CPP [place] [id number]: [phone]: [phone]/2022 9:27 AM

## 2020-08-04 ENCOUNTER — Telehealth: Payer: Self-pay | Admitting: Pharmacist

## 2020-08-04 DIAGNOSIS — I10 Essential (primary) hypertension: Secondary | ICD-10-CM

## 2020-08-04 LAB — BASIC METABOLIC PANEL
BUN/Creatinine Ratio: 7 — ABNORMAL LOW (ref 10–24)
BUN: 14 mg/dL (ref 8–27)
CO2: 23 mmol/L (ref 20–29)
Calcium: 9.6 mg/dL (ref 8.6–10.2)
Chloride: 104 mmol/L (ref 96–106)
Creatinine, Ser: 1.91 mg/dL — ABNORMAL HIGH (ref 0.76–1.27)
Glucose: 101 mg/dL — ABNORMAL HIGH (ref 65–99)
Potassium: 4.3 mmol/L (ref 3.5–5.2)
Sodium: 144 mmol/L (ref 134–144)
eGFR: 36 mL/min/{1.73_m2} — ABNORMAL LOW (ref >59)

## 2020-08-04 NOTE — Telephone Encounter (Signed)
BMET checked after starting low dose valsartan 80mg  daily for HTN/CKD/DM benefit. SCr has increased although more recent SCr trends from PCP office don't flow into Epic. Pulled from most recent note for trends:  06/09/20: 1.66 12/15/19: 1.68 06/15/19: 1.43 03/24/19: 1.62 03/02/19: 2.31 02/26/19: 2.02 12/15/18: 1.93 12/30/17: 1.97  Current SCr more in line from prior values in 2019-2020; reflective of 13% increase from May 2022 labs which is an acceptable bump. Will not increase valsartan dose for the time being though - will continue current meds, encourage pt to stay hydrated, and recheck BMET in 2 weeks to reassess SCr. Pt will go to [facility] in [facility] since this is more convenient for him. He will monitor BP at home as well.

## 2020-08-05 ENCOUNTER — Other Ambulatory Visit: Payer: Self-pay

## 2020-08-05 MED ORDER — FUROSEMIDE 40 MG PO TABS: 40.0000 mg | ORAL_TABLET | Freq: Every day | ORAL | Status: DC

## 2020-08-19 LAB — BASIC METABOLIC PANEL
BUN/Creatinine Ratio: 10 (ref 10–24)
BUN: 16 mg/dL (ref 8–27)
CO2: 25 mmol/L (ref 20–29)
Calcium: 9.9 mg/dL (ref 8.6–10.2)
Chloride: 99 mmol/L (ref 96–106)
Creatinine, Ser: 1.64 mg/dL — ABNORMAL HIGH (ref 0.76–1.27)
Glucose: 105 mg/dL — ABNORMAL HIGH (ref 65–99)
Potassium: 4.1 mmol/L (ref 3.5–5.2)
Sodium: 139 mmol/L (ref 134–144)
eGFR: 43 mL/min/{1.73_m2} — ABNORMAL LOW (ref >59)

## 2020-08-29 NOTE — Telephone Encounter (Signed)
SCr improved back to baseline with continued ARB therapy. Pt reports home SBP ranging 128-135, most under 130. Will continue current meds for now. Advised pt to call clinic if his SBP consistently trends > 130, can increase dose of carvedilol if needed.

## 2020-09-26 ENCOUNTER — Other Ambulatory Visit: Payer: Self-pay | Admitting: Cardiology

## 2020-10-27 ENCOUNTER — Other Ambulatory Visit: Payer: Self-pay | Admitting: Cardiology

## 2020-10-27 NOTE — Telephone Encounter (Signed)
This is Dr. Schumann's pt 

## 2020-12-23 ENCOUNTER — Other Ambulatory Visit: Payer: Self-pay | Admitting: Cardiology

## 2021-01-22 ENCOUNTER — Other Ambulatory Visit: Payer: Self-pay | Admitting: Cardiology

## 2021-03-22 ENCOUNTER — Other Ambulatory Visit: Payer: Self-pay | Admitting: Cardiology

## 2021-03-23 ENCOUNTER — Other Ambulatory Visit: Payer: Self-pay | Admitting: Cardiology

## 2021-04-22 ENCOUNTER — Other Ambulatory Visit: Payer: Self-pay | Admitting: Cardiology

## 2021-05-10 ENCOUNTER — Other Ambulatory Visit: Payer: Self-pay | Admitting: Cardiology

## 2021-05-12 ENCOUNTER — Emergency Department (HOSPITAL_BASED_OUTPATIENT_CLINIC_OR_DEPARTMENT_OTHER): Payer: Medicare HMO

## 2021-05-12 ENCOUNTER — Emergency Department (HOSPITAL_BASED_OUTPATIENT_CLINIC_OR_DEPARTMENT_OTHER)
Admission: EM | Admit: 2021-05-12 | Discharge: 2021-05-12 | Disposition: A | Discharge status: Home / Self Care | Payer: Medicare HMO | Attending: Emergency Medicine | Admitting: Emergency Medicine

## 2021-05-12 ENCOUNTER — Encounter (HOSPITAL_BASED_OUTPATIENT_CLINIC_OR_DEPARTMENT_OTHER): Payer: Self-pay

## 2021-05-12 DIAGNOSIS — Z79899 Other long term (current) drug therapy: Secondary | ICD-10-CM | POA: Diagnosis not present

## 2021-05-12 DIAGNOSIS — U071 COVID-19: Secondary | ICD-10-CM | POA: Insufficient documentation

## 2021-05-12 DIAGNOSIS — Z7901 Long term (current) use of anticoagulants: Secondary | ICD-10-CM | POA: Diagnosis not present

## 2021-05-12 DIAGNOSIS — R531 Weakness: Secondary | ICD-10-CM | POA: Diagnosis present

## 2021-05-12 DIAGNOSIS — E119 Type 2 diabetes mellitus without complications: Secondary | ICD-10-CM | POA: Diagnosis not present

## 2021-05-12 DIAGNOSIS — I10 Essential (primary) hypertension: Secondary | ICD-10-CM | POA: Diagnosis not present

## 2021-05-12 LAB — CBC WITH DIFFERENTIAL/PLATELET
Abs Immature Granulocytes: 0.03 10*3/uL (ref 0.00–0.07)
Basophils Absolute: 0 10*3/uL (ref 0.0–0.1)
Basophils Relative: 0 %
Eosinophils Absolute: 0 10*3/uL (ref 0.0–0.5)
Eosinophils Relative: 0 %
HCT: 30.6 % — ABNORMAL LOW (ref 39.0–52.0)
Hemoglobin: 9.7 g/dL — ABNORMAL LOW (ref 13.0–17.0)
Immature Granulocytes: 0 %
Lymphocytes Relative: 10 %
Lymphs Abs: 0.8 10*3/uL (ref 0.7–4.0)
MCH: 23.5 pg — ABNORMAL LOW (ref 26.0–34.0)
MCHC: 31.7 g/dL (ref 30.0–36.0)
MCV: 74.3 fL — ABNORMAL LOW (ref 80.0–100.0)
Monocytes Absolute: 1 10*3/uL (ref 0.1–1.0)
Monocytes Relative: 13 %
Neutro Abs: 6.1 10*3/uL (ref 1.7–7.7)
Neutrophils Relative %: 77 %
Platelets: 228 10*3/uL (ref 150–400)
RBC: 4.12 MIL/uL — ABNORMAL LOW (ref 4.22–5.81)
RDW: 15.2 % (ref 11.5–15.5)
WBC: 7.9 10*3/uL (ref 4.0–10.5)
nRBC: 0 % (ref 0.0–0.2)

## 2021-05-12 LAB — BASIC METABOLIC PANEL
Anion gap: 11 (ref 5–15)
BUN: 15 mg/dL (ref 8–23)
CO2: 23 mmol/L (ref 22–32)
Calcium: 9.1 mg/dL (ref 8.9–10.3)
Chloride: 102 mmol/L (ref 98–111)
Creatinine, Ser: 1.92 mg/dL — ABNORMAL HIGH (ref 0.61–1.24)
GFR, Estimated: 35 mL/min — ABNORMAL LOW (ref >60)
Glucose, Bld: 104 mg/dL — ABNORMAL HIGH (ref 70–99)
Potassium: 3.6 mmol/L (ref 3.5–5.1)
Sodium: 136 mmol/L (ref 135–145)

## 2021-05-12 LAB — RESP PANEL BY RT-PCR (FLU A&B, COVID) ARPGX2
Influenza A by PCR: NEGATIVE
Influenza B by PCR: NEGATIVE
SARS Coronavirus 2 by RT PCR: POSITIVE — AB

## 2021-05-12 MED ORDER — NIRMATRELVIR/RITONAVIR (PAXLOVID) TABLET (RENAL DOSING): 2.0000 | ORAL_TABLET | Freq: Two times a day (BID) | ORAL | Status: DC

## 2021-05-12 MED ORDER — ACETAMINOPHEN 325 MG PO TABS
650.0000 mg | ORAL_TABLET | Freq: Once | ORAL | Status: AC | PRN
  Administered 2021-05-12: 650 mg via ORAL
  Filled 2021-05-12: qty 2

## 2021-05-12 NOTE — ED Triage Notes (Signed)
Pt arrives with c/o generalized weakness that started this morning. Per pt, he has had cough, runny nose, and some SOB. Pt unsure if he has had a fever.  ?

## 2021-05-12 NOTE — ED Provider Notes (Addendum)
?[facility] EMERGENCY DEPARTMENT ?Provider Note ? ? ?CSN: [id number] ?Arrival date & time: 05/12/21  1738 ? ?  ? ?History ? ?Chief Complaint  ?Patient presents with  ? Generalized Weakness  ? ? ?Martin Richards is a 81 y.o. male. ? ?HPI ? ?81 year old male with past medical history of HTN, DM presents to the emergency department generalized weakness.  Patient states that this started last night and was more significant this morning.  He feels generally weak.  He has had an intermittent dry cough, runny nose.  Denies any acute chest pain, difficulty breathing, swelling of his lower extremities.  He had a decreased appetite but denies any nausea/vomiting/diarrhea.  No known sick contacts.  No headache or neck pain. ? ?Home Medications ?Prior to Admission medications   ?Medication Sig Start Date End Date Taking? Authorizing Provider  ?allopurinol (ZYLOPRIM) 100 MG tablet Take 100 mg by mouth daily.    [provider]  ?amLODipine (NORVASC) 10 MG tablet Take 1 tablet (10 mg total) by mouth daily. 06/09/20   Donato Heinz, MD  ?apixaban (ELIQUIS) 5 MG TABS tablet Take 1 tablet (5 mg total) by mouth 2 (two) times daily. 03/08/19   Shelly Coss, MD  ?atorvastatin (LIPITOR) 40 MG tablet Take 40 mg by mouth daily. 06/08/19   [provider]  ?Blood Glucose Monitoring Suppl (TRUE METRIX METER) w/Device KIT  12/07/19   [provider]  ?calcitRIOL (ROCALTROL) 0.5 MCG capsule Take 0.5 mcg by mouth daily.    [provider]  ?carvedilol (COREG) 6.25 MG tablet Take 1 tablet by mouth twice daily 01/24/21   Donato Heinz, MD  ?cholecalciferol (VITAMIN D) 25 MCG tablet Take 1 tablet (1,000 Units total) by mouth daily. 03/09/19   Shelly Coss, MD  ?colchicine 0.6 MG tablet Take 0.6-1.2 mg by mouth 2 (two) times daily as needed (gout).     [provider]  ?FeFum-FePoly-FA-B Cmp-C-Biot (INTEGRA PLUS) CAPS Take 1 capsule by mouth daily.  03/24/19   [provider]  ?finasteride (PROSCAR) 5 MG tablet Take 5 mg by mouth daily.     [provider]  ?furosemide (LASIX) 40 MG tablet Take 1 tablet (40 mg total) by mouth daily. 08/05/20   Donato Heinz, MD  ?liraglutide (VICTOZA) 18 MG/3ML SOPN Victoza 3-Pak 0.6 mg/0.1 mL (18 mg/3 mL) subcutaneous pen injector ? INJECT 1.8 MG UNDER THE SKIN ONCE DAILY    [provider]  ?magnesium oxide (MAG-OX) 400 (241.3 Mg) MG tablet Take 2 tablets (800 mg total) by mouth daily. 03/09/19   Shelly Coss, MD  ?Multiple Vitamin (MULITIVITAMIN WITH MINERALS) TABS Take 1 tablet by mouth daily.    [provider]  ?pantoprazole (PROTONIX) 40 MG tablet Take 1 tablet (40 mg total) by mouth daily. 03/08/19 03/07/20  Shelly Coss, MD  ?phosphorus (K PHOS NEUTRAL) [id number] MG tablet Take 1 tablet (250 mg total) by mouth 3 (three) times daily. 03/08/19   Shelly Coss, MD  ?potassium chloride SA (KLOR-CON) 20 MEQ tablet Take 20 mEq by mouth daily.    [provider]  ?tamsulosin (FLOMAX) 0.4 MG CAPS capsule tamsulosin 0.4 mg capsule ? TAKE 1 CAPSULE BY MOUTH ONCE DAILY 06/29/19   [provider]  ?traMADol (ULTRAM) 50 MG tablet Take 1-2 tablets (50-100 mg total) by mouth every 6 (six) hours as needed. 06/18/19   Armandina Gemma, MD  ?valsartan (DIOVAN) 80 MG tablet Take 1 tablet (80 mg total) by mouth daily. PATIENT NEEDS  TO SCHEDULE AN OFFICE VISIT WITH DR [facility] FOR FUTURE REFILLS. (LAST ATTEMPT) 04/24/21   Donato Heinz, MD  ?   ? ?Allergies    ?Patient has no known allergies.   ? ?Review of Systems   ?Review of Systems  ?Constitutional:  Positive for appetite change, chills and fatigue. Negative for fever.  ?HENT:  Positive for congestion and rhinorrhea. Negative for sore throat.   ?Respiratory:  Positive for cough. Negative for shortness of breath.   ?Cardiovascular:  Negative for chest pain.  ?Gastrointestinal:  Negative for abdominal pain, diarrhea, nausea and vomiting.   ?Musculoskeletal:  Negative for back pain and neck stiffness.  ?Skin:  Negative for rash.  ?Neurological:  Negative for headaches.  ? ?Physical Exam ?Updated Vital Signs ?BP (!) 150/71   Pulse 87   Temp 100.1 ?F (37.8 ?C) (Oral)   Resp (!) 29   Ht $R'6\' 2"'Rf$  (1.88 m)   Wt 97.5 kg   SpO2 97%   BMI 27.60 kg/m?  ?Physical Exam ?Vitals and nursing note reviewed.  ?Constitutional:   ?   General: He is not in acute distress. ?   Appearance: Normal appearance. He is not toxic-appearing or diaphoretic.  ?HENT:  ?   Head: Normocephalic.  ?   Nose: Congestion present.  ?   Mouth/Throat:  ?   Mouth: Mucous membranes are moist.  ?   Pharynx: No oropharyngeal exudate or posterior oropharyngeal erythema.  ?Cardiovascular:  ?   Rate and Rhythm: Normal rate.  ?Pulmonary:  ?   Effort: Pulmonary effort is normal. No respiratory distress.  ?   Breath sounds: No wheezing or rales.  ?Abdominal:  ?   Palpations: Abdomen is soft.  ?   Tenderness: There is no abdominal tenderness.  ?Musculoskeletal:     ?   General: No swelling.  ?   Cervical back: No rigidity.  ?Skin: ?   General: Skin is warm.  ?Neurological:  ?   Mental Status: He is alert and oriented to person, place, and time. Mental status is at baseline.  ?Psychiatric:     ?   Mood and Affect: Mood normal.  ? ? ?ED Results / Procedures / Treatments   ?Labs ?(all labs ordered are listed, but only abnormal results are displayed) ?Labs Reviewed  ?RESP PANEL BY RT-PCR (FLU A&B, COVID) ARPGX2 - Abnormal; Notable for the following components:  ?    Result Value  ? SARS Coronavirus 2 by RT PCR POSITIVE (*)   ? All other components within normal limits  ?CBC WITH DIFFERENTIAL/PLATELET  ?BASIC METABOLIC PANEL  ? ? ?EKG ?None ? ?Radiology ?DG Chest Port 1 View ? ?Result Date: 05/12/2021 ?CLINICAL DATA:  Generalized weakness, cough EXAM: PORTABLE CHEST 1 VIEW COMPARISON:  06/28/2019 FINDINGS: The heart size and mediastinal contours are within normal limits. Both lungs are clear. The  visualized skeletal structures are unremarkable. IMPRESSION: Normal study. Electronically Signed   By: Rolm Baptise M.D.   On: 05/12/2021 19:11   ? ?Procedures ?Procedures  ? ? ?Medications Ordered in ED ?Medications  ?acetaminophen (TYLENOL) tablet 650 mg (650 mg Oral Given 05/12/21 1829)  ? ? ?ED Course/ Medical Decision Making/ A&P ?  ?                        ?Medical Decision Making ?Amount and/or Complexity of Data Reviewed ?Labs: ordered. ?Radiology: ordered. ? ?Risk ?OTC drugs. ? ? ?81 year old male presents emergency department 1 day of generalized  weakness.  Vitals are stable on arrival, he appears fatigued but in no acute distress.  Lung sounds are clear, abdomen is benign. ? ?Patient is COVID-positive.  Blood work otherwise shows a baseline anemia and kidney dysfunction.  Chest x-ray shows no pneumonia.  Plan for symptomatic treatment, not Paxlovid candidate due to eliquis.  Plan for outpatient follow-up, he is otherwise tolerating oral with no other acute complaints.  Patient at this time appears safe and stable for discharge and close outpatient follow up. Discharge plan and strict return to ED precautions discussed, patient verbalizes understanding and agreement. ? ? ? ? ? ? ? ?Final Clinical Impression(s) / ED Diagnoses ?Final diagnoses:  ?None  ? ? ?Rx / DC Orders ?ED Discharge Orders   ? ? None  ? ?  ? ? ?  ?Lorelle Gibbs, DO ?05/12/21 2126 ? ?  ?Lorelle Gibbs, DO ?05/12/21 2152 ? ?

## 2021-05-12 NOTE — Discharge Instructions (Signed)
You have been seen and discharged from the emergency department.  You have been diagnosed with COVID.  The rest of your work-up was baseline/normal.  It is not recommended to take Paxlovid with your Eliquis prescription.  Treat yourself symptomatically with over-the-counter medications.  Stay well-hydrated.  Follow-up with your primary provider for further evaluation and further care. Take home medications as prescribed. If you have any worsening symptoms or further concerns for your health please return to an emergency department for further evaluation. ?

## 2021-07-11 ENCOUNTER — Other Ambulatory Visit: Payer: Self-pay | Admitting: Cardiology

## 2021-08-01 ENCOUNTER — Other Ambulatory Visit: Payer: Self-pay | Admitting: Cardiology

## 2021-08-25 ENCOUNTER — Other Ambulatory Visit: Payer: Self-pay | Admitting: Cardiology

## 2021-09-21 ENCOUNTER — Other Ambulatory Visit: Payer: Self-pay | Admitting: Cardiology

## 2021-09-22 ENCOUNTER — Other Ambulatory Visit: Payer: Self-pay | Admitting: Cardiology

## 2021-10-03 NOTE — Progress Notes (Unsigned)
Cardiology Office Note:    Date:  10/04/2021   ID:  Martin Richards, DOB 10-Mar-1940, MRN [id number]  PCP:  Andi Devon, MD  Cardiologist:  Little Ishikawa, MD  Electrophysiologist:  None   Referring MD: Andi Devon, MD   Chief Complaint: follow-up of atrial fibrillation  History of Present Illness:    Martin Richards is a 81 y.o. 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 Bjorn Pippin and presents today for follow-up of atrial fibrillation.  Patient was diagnosed with atrial fibrillation during an admission in 02/2019 for dehydration and orthostatic hypotension. Echo showed LVEF of 65-70% with normal wall motion and moderate LVH of the basal-septal segment as well as mild aortic valve sclerosis but no AS. He was started on a Cardizem drip and Eliquis and converted back to sinus rhythm. Durng that admission, he was noted to have a positive hemoccult and underwent EGD/colonoscopy. EGD showed gastritis and colonoscopy showed multiple polyps which were removed. Shortly after discharge, he was readmitted in [facility] with a GI bleed. His Eliquis was held and he required multiple transfusions and GI bleeding resolved. No repeat EGD/ colonoscopy were performed. He saw GI and Eliquis was restarted. GI reportedly felt that bleeding was secondary to starting Eliquis too soon after polyps were removed.    Outpatient cardiac MRI was ordered to help determined etiology of basal septal hypertrophy (amyloidosis vs HCM vs hypertensive heart disease). This was performed in 05/2019 showed normal LV size with LVEF of 58% and mild focal basal septal hypertrophy, normal RV size with RVEF of 58%, and no myocardial LGE. No definitive evidence of prior MI, myocarditis, or infiltrative disease.   Patient was last seen by  Dr. Bjorn Pippin in 05/2020 at which time he was doing well from a cardiac standpoint. Diltiazem was switched to Coreg given patient was also on Amlodipine. Patient was seen in the PharmD HTN clinic in 07/2020 at which time BP was much better controlled compared to prior office visit and only slightly above goal. He was continued on his current medications.   Patient presents today for routine follow-up. Patient is doing well from a cardiac standpoint. He denies any chest pain. He reports some mild shortness of breath if he walks long distance but he states he has had this all his life. No new shortness of breath, orthopnea, PND, lower extremity edema, palpitations, lightheadedness, dizziness, or near syncope/syncope. He denies any abnormal bleeding on the Eliquis.   His BP is elevated in the office today. Initially 160/74 but then 144/68 on my personal recheck at the end of visit. He states reading at end of visit is close to what it is at home. He has only been taking his Coreg once daily.   Past Medical History:  Diagnosis Date   Diabetes mellitus    Dysrhythmia    Hx. AFIB   Gout    Hypertension     Past Surgical History:  Procedure Laterality Date   BIOPSY  03/06/2019   Procedure: BIOPSY;  Surgeon: Jeani Hawking, MD;  Location: [facility] ENDOSCOPY;  Service: Endoscopy;;   CHOLECYSTECTOMY     COLONOSCOPY WITH PROPOFOL N/A 03/06/2019   Procedure: COLONOSCOPY WITH PROPOFOL;  Surgeon: Jeani Hawking, MD;  Location: [facility] ENDOSCOPY;  Service: Endoscopy;  Laterality: N/A;   ESOPHAGOGASTRODUODENOSCOPY (EGD) WITH PROPOFOL N/A 03/06/2019   Procedure: ESOPHAGOGASTRODUODENOSCOPY (EGD)  WITH PROPOFOL;  Surgeon: Carol Ada, MD;  Location: [facility];  Service: Endoscopy;  Laterality: N/A;   HEMOSTASIS CLIP PLACEMENT  03/06/2019   Procedure: HEMOSTASIS CLIP PLACEMENT;  Surgeon: Carol Ada, MD;  Location: [facility];  Service: Endoscopy;;   PARATHYROIDECTOMY N/A 06/18/2019   Procedure: NECK EXPLORATION AND LEFT  INFERIOR PARATHYROIDECTOMY;  Surgeon: Armandina Gemma, MD;  Location: [facility];  Service: General;  Laterality: N/A;   POLYPECTOMY  03/06/2019   Procedure: POLYPECTOMY;  Surgeon: Carol Ada, MD;  Location: [facility] ENDOSCOPY;  Service: Endoscopy;;   SUBMUCOSAL LIFTING INJECTION  03/06/2019   Procedure: SUBMUCOSAL LIFTING INJECTION;  Surgeon: Carol Ada, MD;  Location: [facility];  Service: Endoscopy;;    Current Medications: Current Meds  Medication Sig   allopurinol (ZYLOPRIM) 100 MG tablet Take 100 mg by mouth daily.   amLODipine (NORVASC) 10 MG tablet Take 1 tablet (10 mg total) by mouth daily.   atorvastatin (LIPITOR) 40 MG tablet Take 40 mg by mouth daily.   Blood Glucose Monitoring Suppl (TRUE METRIX METER) w/Device KIT    calcitRIOL (ROCALTROL) 0.5 MCG capsule Take 0.5 mcg by mouth daily.   cholecalciferol (VITAMIN D) 25 MCG tablet Take 1 tablet (1,000 Units total) by mouth daily.   colchicine 0.6 MG tablet Take 0.6-1.2 mg by mouth 2 (two) times daily as needed (gout).    FeFum-FePoly-FA-B Cmp-C-Biot (INTEGRA PLUS) CAPS Take 1 capsule by mouth daily.    finasteride (PROSCAR) 5 MG tablet Take 5 mg by mouth daily.    liraglutide (VICTOZA) 18 MG/3ML SOPN Victoza 3-Pak 0.6 mg/0.1 mL (18 mg/3 mL) subcutaneous pen injector  INJECT 1.8 MG UNDER THE SKIN ONCE DAILY   magnesium oxide (MAG-OX) 400 (241.3 Mg) MG tablet Take 2 tablets (800 mg total) by mouth daily.   Multiple Vitamin (MULITIVITAMIN WITH MINERALS) TABS Take 1 tablet by mouth daily.   phosphorus (K PHOS NEUTRAL) 155-852-130 MG tablet Take 1 tablet (250 mg total) by mouth 3 (three) times daily.   potassium chloride SA (KLOR-CON) 20 MEQ tablet Take 20 mEq by mouth daily.   tamsulosin (FLOMAX) 0.4 MG CAPS capsule tamsulosin 0.4 mg capsule  TAKE 1 CAPSULE BY MOUTH ONCE DAILY   traMADol (ULTRAM) 50 MG tablet Take 1-2 tablets (50-100 mg total) by mouth every 6 (six) hours as needed.   valsartan (DIOVAN) 80 MG tablet Take 1 tablet (80 mg  total) by mouth daily. PATIENT NEEDS TO SCHEDULE AN OFFICE VISIT WITH DR Gardiner Rhyme FOR FUTURE REFILLS. (LAST ATTEMPT)   [DISCONTINUED] apixaban (ELIQUIS) 5 MG TABS tablet Take 1 tablet (5 mg total) by mouth 2 (two) times daily.   [DISCONTINUED] carvedilol (COREG) 6.25 MG tablet TAKE 1 TABLET BY MOUTH TWICE DAILY WITH MEALS   [DISCONTINUED] furosemide (LASIX) 40 MG tablet Take 1 tablet (40 mg total) by mouth daily.     Allergies:   Patient has no known allergies.   Social History   Socioeconomic History   Marital status: Married    Spouse name: Not on file   Number of children: Not on file   Years of education: Not on file   Highest education level: Not on file  Occupational History   Not on file  Tobacco Use   Smoking status: Never   Smokeless tobacco: Never  Vaping Use   Vaping Use: Never used  Substance and Sexual Activity   Alcohol use: Yes    Comment: rare   Drug use: No   Sexual activity: Not on file  Other Topics  Concern   Not on file  Social History Narrative   Not on file   Social Determinants of Health   Financial Resource Strain: Not on file  Food Insecurity: Not on file  Transportation Needs: Not on file  Physical Activity: Not on file  Stress: Not on file  Social Connections: Not on file     Family History: The patient's family history includes Cancer in his sister; Diabetes in his brother; Hypertension in his brother, father, and mother.  ROS:   Please see the history of present illness.     EKGs/Labs/Other Studies Reviewed:    The following studies were reviewed:  Echocardiogram 03/04/2019: Impressions:  1. Left ventricular ejection fraction, by estimation, is 65 to 70%. The  left ventricle has normal function. The left ventrical has no regional  wall motion abnormalities. There is moderately increased left ventricular  hypertrophy of the basal-septal  segment. Left ventricular diastolic function could not be evaluated  secondary to atrial  fibrillation.   2. Right ventricular systolic function is normal. The right ventricular  size is normal. Tricuspid regurgitation signal is inadequate for assessing  PA pressure.   3. The mitral valve is normal in structure and function. trivial mitral  valve regurgitation. No evidence of mitral stenosis.   4. The aortic valve is tricuspid. Aortic valve regurgitation is not  visualized. Mild aortic valve sclerosis is present, with no evidence of  aortic valve stenosis.   5. The inferior vena cava is dilated in size with <50% respiratory  variability, suggesting right atrial pressure of 15 mmHg. _______________  Cardiac MRI 06/10/2019: Impressions: 1.  Normal LV size with mild focal basal septal hypertrophy, EF 58%. 2.  Normal RV size with EF 58%. 3. No myocardial LGE, so no definitive evidence for prior MI, myocarditis, or infiltrative disease. 4.  Normal ECV   No evidence for significant pathology, possible "senile septum."  EKG:  EKG not ordered today.   Recent Labs: 05/12/2021: BUN 15; Creatinine, Ser 1.92; Hemoglobin 9.7; Platelets 228; Potassium 3.6; Sodium 136  Recent Lipid Panel No results found for: "CHOL", "TRIG", "HDL", "CHOLHDL", "VLDL", "LDLCALC", "LDLDIRECT"  Physical Exam:    Vital Signs: BP (!) 144/68 (BP Location: Left Arm, Patient Position: Sitting)   Pulse 73   Ht $R'6\' 2"'dg$  (1.88 m)   Wt 215 lb 12.8 oz (97.9 kg)   SpO2 98%   BMI 27.71 kg/m     Wt Readings from Last 3 Encounters:  10/04/21 215 lb 12.8 oz (97.9 kg)  05/12/21 215 lb (97.5 kg)  05/23/20 222 lb 9.6 oz (101 kg)     General: 81 y.o. male African-American male in no acute distress. HEENT: Normocephalic and atraumatic. Sclera clear. EOMs intact. Neck: Supple. No carotid bruits. No JVD. Heart: RRR. Distinct S1 and S2. No murmurs, gallops, or rubs. Radial and 2+ and equal bilaterally. Lungs: No increased work of breathing. Clear to ausculation bilaterally. No wheezes, rhonchi, or rales.  Abdomen:  Soft, non-distended, and non-tender to palpation.  Extremities: No lower extremity edema.    Skin: Warm and dry. Neuro: Alert and oriented x3. No focal deficits. Psych: Normal affect. Responds appropriately.  Assessment:    1. Paroxysmal atrial fibrillation (HCC)   2. Primary hypertension   3. Hyperlipidemia, unspecified hyperlipidemia type   4. Type 2 diabetes mellitus with complication, without long-term current use of insulin (HCC)   5. Stage 3b chronic kidney disease ([place])     Plan:    Paroxysmal Atrial Fibrillation Maintaining  sinus rhythm on exam.  - Continue Coreg 6.25mg  twice daily. - Continue Eliquis 5mg  twice daily. - Will recheck CBC today.  Hypertension BP elevated. Initially 160/74 and then 144/68 on my personal recheck at the end of visit.. - Continue Amlodipine 10mg  daily. - Continue Valsartan 80mg  daily. - Prescribed Coreg 6.25mg  twice daily but states he has only been taking this once daily. Encouraged him to take this twice daily. - Asked patient to keep a BP/HR log for 2 weeks and then send this to [place].  Hyperlipidemia - Continue Lipitor 40mg  daily.  - He is due for repeat labs. He is not fasting today. He is scheduled to see his PCP next month so these can be checked then.  Type 2 Diabetes Mellitus - On Victoza at home.  - Management per PCP.  CKD Stage III Baseline creatinine around 1.4 to 1.9. Creatinine 1.92 on last labs in 04/2021. - Will repeat BMET today.  Disposition: Follow up in 6 months.   Medication Adjustments/Labs and Tests Ordered: Current medicines are reviewed at length with the patient today.  Concerns regarding medicines are outlined above.  Orders Placed This Encounter  Procedures   Basic metabolic panel   CBC   Meds ordered this encounter  Medications   apixaban (ELIQUIS) 5 MG TABS tablet    Sig: Take 1 tablet (5 mg total) by mouth 2 (two) times daily.    Dispense:  180 tablet    Refill:  3   carvedilol (COREG) 6.25 MG  tablet    Sig: Take 1 tablet (6.25 mg total) by mouth 2 (two) times daily with a meal.    Dispense:  180 tablet    Refill:  3    PLEASE SCHEDULE APPT FOR FUTURE REFILLS    Patient Instructions  Medication Instructions:  Your physician recommends that you continue on your current medications as directed. Please refer to the Current Medication list given to you today.   *If you need a refill on your cardiac medications before your next appointment, please call your pharmacy*   Lab Work: Your physician recommends that you complete labs today. BMET CBC If you have labs (blood work) drawn today and your tests are completely normal, you will receive your results only by: [place] (if you have MyChart) OR A paper copy in the mail If you have any lab test that is abnormal or we need to change your treatment, we will call you to review the results.   Testing/Procedures: NONE ordered at this time of appointment     Follow-Up: At [facility], you and your health needs are our priority.  As part of our continuing mission to provide you with exceptional heart care, we have created designated Provider Care Teams.  These Care Teams include your primary Cardiologist (physician) and Advanced Practice Providers (APPs -  Physician Assistants and Nurse Practitioners) who all work together to provide you with the care you need, when you need it.  We recommend signing up for the patient portal called "MyChart".  Sign up information is provided on this After Visit Summary.  MyChart is used to connect with patients for Virtual Visits (Telemedicine).  Patients are able to view lab/test results, encounter notes, upcoming appointments, etc.  Non-urgent messages can be sent to your provider as well.   To learn more about what you can do with MyChart, go to [email or web address].    Your next appointment:   6 month(s)  The format for your next appointment:  In Person  Provider:    Donato Heinz, MD  or Sande Rives, PA-C        Other Instructions Monitor Blood pressure for 2 weeks and send it back to our office via mychart or mail. Important Information About Sugar         Signed, Eppie Gibson  10/04/2021 10:16 AM    [place]

## 2021-10-04 ENCOUNTER — Encounter: Payer: Self-pay | Admitting: Student

## 2021-10-04 ENCOUNTER — Ambulatory Visit: Payer: Medicare HMO | Attending: Student | Admitting: Student

## 2021-10-04 VITALS — BP 144/68 | HR 73 | Ht 74.0 in | Wt 215.8 lb

## 2021-10-04 DIAGNOSIS — I480 Paroxysmal atrial fibrillation: Secondary | ICD-10-CM

## 2021-10-04 DIAGNOSIS — E118 Type 2 diabetes mellitus with unspecified complications: Secondary | ICD-10-CM | POA: Diagnosis not present

## 2021-10-04 DIAGNOSIS — E785 Hyperlipidemia, unspecified: Secondary | ICD-10-CM

## 2021-10-04 DIAGNOSIS — I10 Essential (primary) hypertension: Secondary | ICD-10-CM | POA: Diagnosis not present

## 2021-10-04 DIAGNOSIS — N1832 Chronic kidney disease, stage 3b: Secondary | ICD-10-CM

## 2021-10-04 MED ORDER — CARVEDILOL 6.25 MG PO TABS: 6.2500 mg | ORAL_TABLET | Freq: Two times a day (BID) | ORAL | 3 refills | Status: DC

## 2021-10-04 MED ORDER — APIXABAN 5 MG PO TABS: 5.0000 mg | ORAL_TABLET | Freq: Two times a day (BID) | ORAL | 3 refills | Status: DC

## 2021-10-04 NOTE — Patient Instructions (Addendum)
Medication Instructions:  Your physician recommends that you continue on your current medications as directed. Please refer to the Current Medication list given to you today.   *If you need a refill on your cardiac medications before your next appointment, please call your pharmacy*   Lab Work: Your physician recommends that you complete labs today. BMET CBC If you have labs (blood work) drawn today and your tests are completely normal, you will receive your results only by: [place] (if you have MyChart) OR A paper copy in the mail If you have any lab test that is abnormal or we need to change your treatment, we will call you to review the results.   Testing/Procedures: NONE ordered at this time of appointment     Follow-Up: At [facility], you and your health needs are our priority.  As part of our continuing mission to provide you with exceptional heart care, we have created designated Provider Care Teams.  These Care Teams include your primary Cardiologist (physician) and Advanced Practice Providers (APPs -  Physician Assistants and Nurse Practitioners) who all work together to provide you with the care you need, when you need it.  We recommend signing up for the patient portal called "MyChart".  Sign up information is provided on this After Visit Summary.  MyChart is used to connect with patients for Virtual Visits (Telemedicine).  Patients are able to view lab/test results, encounter notes, upcoming appointments, etc.  Non-urgent messages can be sent to your provider as well.   To learn more about what you can do with MyChart, go to [email or web address].    Your next appointment:   6 month(s)  The format for your next appointment:   In Person  Provider:   Donato Heinz, MD  or Sande Rives, PA-C        Other Instructions Monitor Blood pressure for 2 weeks and send it back to our office via mychart or mail. Important Information About  Sugar

## 2021-10-05 LAB — BASIC METABOLIC PANEL
BUN/Creatinine Ratio: 10 (ref 10–24)
BUN: 18 mg/dL (ref 8–27)
CO2: 26 mmol/L (ref 20–29)
Calcium: 9.8 mg/dL (ref 8.6–10.2)
Chloride: 101 mmol/L (ref 96–106)
Creatinine, Ser: 1.78 mg/dL — ABNORMAL HIGH (ref 0.76–1.27)
Glucose: 99 mg/dL (ref 70–99)
Potassium: 4.4 mmol/L (ref 3.5–5.2)
Sodium: 141 mmol/L (ref 134–144)
eGFR: 39 mL/min/{1.73_m2} — ABNORMAL LOW (ref >59)

## 2021-10-05 LAB — CBC
Hematocrit: 36.5 % — ABNORMAL LOW (ref 37.5–51.0)
Hemoglobin: 11.3 g/dL — ABNORMAL LOW (ref 13.0–17.7)
MCH: 23.4 pg — ABNORMAL LOW (ref 26.6–33.0)
MCHC: 31 g/dL — ABNORMAL LOW (ref 31.5–35.7)
MCV: 76 fL — ABNORMAL LOW (ref 79–97)
Platelets: 223 10*3/uL (ref 150–450)
RBC: 4.83 x10E6/uL (ref 4.14–5.80)
RDW: 15.5 % — ABNORMAL HIGH (ref 11.6–15.4)
WBC: 7.5 10*3/uL (ref 3.4–10.8)

## 2021-10-28 ENCOUNTER — Telehealth: Payer: Self-pay | Admitting: Student

## 2021-10-28 NOTE — Telephone Encounter (Signed)
   Reviewed patient's log of his home BP and heart rate that he sent in from 10/05/2021 to 10/20/2021. BP mostly in the 130s-140s/60-70s and heart rates mostly in the 70s. BP is not terrible but is slightly above goal of <130/80.  At my last visit with patient, he stated he was only taking his Coreg once daily. I advised him to increase this to twice daily.   Sharyn Lull, can you please call patient to see if he is taking his Coreg twice a day? If not, let's just have him start taking it twice daily. If he is taking it twice daily, let's increase it to 12.'5mg'$  twice daily. I think his heart rate will tolerate this fine but he should continue to monitor his BP/HR closely after this and let us know if his heart rate is consistently <55 bpm or if he is having any lightheadedness, dizziness, or near syncope.  Thank you! Quaniyah Bugh

## 2021-10-30 NOTE — Telephone Encounter (Signed)
Returned call to pt, he states that he is only taking Carvedilol 6.'25mg'$  once daily. He will increase to BID. He will take BP/HR  1 hour after taking his medication. He will take medication before breakfast and then again before bed. He denies any lightheadedness, dizziness, or near syncope. Verbalized understanding. He will call if HR <55 or any sx.

## 2021-12-01 ENCOUNTER — Other Ambulatory Visit: Payer: Self-pay

## 2021-12-01 ENCOUNTER — Telehealth: Payer: Self-pay | Admitting: Cardiology

## 2021-12-01 MED ORDER — CARVEDILOL 6.25 MG PO TABS: 6.2500 mg | ORAL_TABLET | Freq: Two times a day (BID) | ORAL | 3 refills | Status: DC

## 2021-12-01 NOTE — Telephone Encounter (Signed)
Pt calling stating that his medication carvedilol was increased to pt taking 2 tablets BID, this change is not on pt's medication list. Please address pt out of medication. Thanks

## 2021-12-01 NOTE — Telephone Encounter (Signed)
*  STAT* If patient is at the pharmacy, call can be transferred to refill team.   1. Which medications need to be refilled? (please list name of each medication and dose if known)  carvedilol (COREG) 6.25 MG tablet  2. Which pharmacy/location (including street and city if local pharmacy) is medication to be sent to? [id number] - [place], [place] - [address]    3. Do they need a 30 day or 90 day supply?  90 day supply  Patient states after today he will be completely out of medication. He will not have enough to get through the weekend.

## 2021-12-08 NOTE — Telephone Encounter (Signed)
Called pt he reports that he thought that he lost his bottle so he called the pharmacy. He later found it so he can continue to take Carvedilol. Also, he had not scheduled follow up, appt in May 2024 scheduled

## 2021-12-08 NOTE — Telephone Encounter (Signed)
Per last phone note in 10/2021, it looks like he should only be on one tablet of Coreg 6.'25mg'$  twice daily.   Thank you!  Desarae Placide

## 2022-04-23 IMAGING — MR MR CARD MORPHOLOGY WO/W CM
42 of 45 series · 42 of 45 positions shown · IV contrast (Contrast agent)
Comparison: none

CLINICAL DATA: ?Hypertrophic cardiomyopathy

EXAM:
CARDIAC MRI
TECHNIQUE: The patient was scanned on a 1.5 Tesla GE magnet. A dedicated
cardiac coil was used. Functional imaging was done using Fiesta
sequences. [DATE], and 4 chamber views were done to assess for RWMA's.
Modified Ceejay rule using a short axis stack was used to
calculate an ejection fraction on a dedicated work station using
Circle software. The patient received 8 cc of Gadavist. After 10
minutes inversion recovery sequences were used to assess for
infiltration and scar tissue.

[Series 4: t2_haste_db_tra_bh · axial · 8.0mm · 1.41mm/px · 1 of 16 slices shown]
[im 1/16]
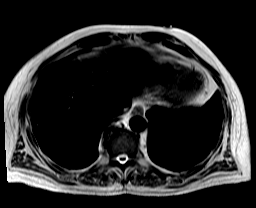

[Series 8: bSSFP · oblique · 8.0mm · 1.61mm/px · 1 of 25 slices shown (1 of 21)]
[im 1/25]
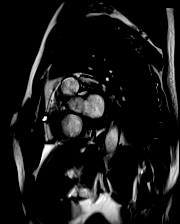

[Series 10: bSSFP · oblique · 8.0mm · 1.61mm/px · 1 of 25 slices shown (2 of 21)]
[im 1/25]
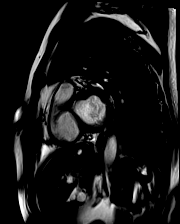

[Series 11: bSSFP · oblique · 8.0mm · 1.61mm/px · 1 of 25 slices shown (3 of 21)]
[im 1/25]
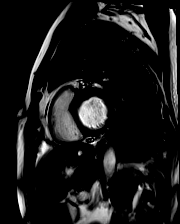

[Series 12: bSSFP · oblique · 8.0mm · 1.61mm/px · 1 of 25 slices shown (4 of 21)]
[im 1/25]
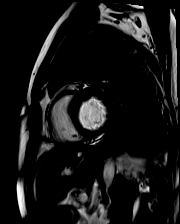

[Series 13: bSSFP · oblique · 8.0mm · 1.61mm/px · 1 of 25 slices shown (5 of 21)]
[im 1/25]
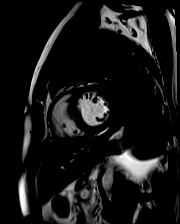

[Series 14: bSSFP · oblique · 8.0mm · 1.61mm/px · 1 of 25 slices shown (6 of 21)]
[im 1/25]
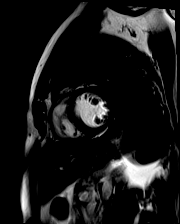

[Series 15: bSSFP · oblique · 8.0mm · 1.61mm/px · 1 of 25 slices shown (7 of 21)]
[im 1/25]
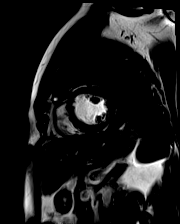

[Series 16: bSSFP · oblique · 8.0mm · 1.61mm/px · 1 of 25 slices shown (8 of 21)]
[im 1/25]
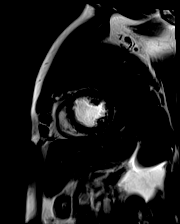

[Series 17: bSSFP · oblique · 8.0mm · 1.61mm/px · 1 of 25 slices shown (9 of 21)]
[im 1/25]
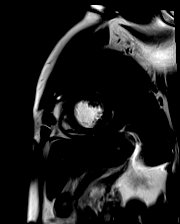

[Series 18: bSSFP · oblique · 8.0mm · 1.61mm/px · 1 of 25 slices shown (10 of 21)]
[im 1/25]
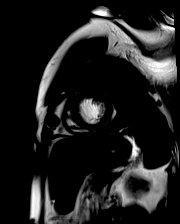

[Series 19: bSSFP · oblique · 8.0mm · 1.61mm/px · 1 of 25 slices shown (11 of 21)]
[im 1/25]
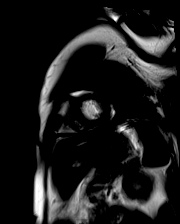

[Series 20: bSSFP · oblique · 8.0mm · 1.61mm/px · 1 of 25 slices shown (12 of 21)]
[im 1/25]
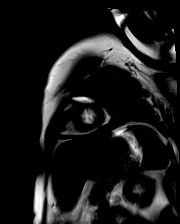

[Series 21: bSSFP · oblique · 8.0mm · 1.61mm/px · 1 of 25 slices shown (13 of 21)]
[im 1/25]
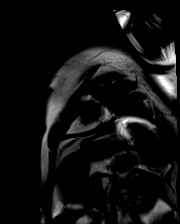

[Series 22: (id)_long_t1 · oblique · 8.0mm · 1.41mm/px · 1 of 24 slices shown]
[im 1/24]
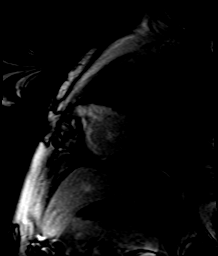

[Series 23: (id)_long_t1_moco · oblique · 8.0mm · 1.41mm/px · 1 of 24 slices shown]
[im 1/24]
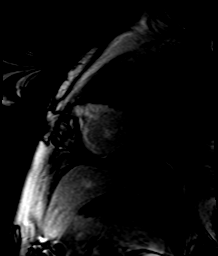

[Series 24: (id)_long_t1_moco_t1 · 1 of 3 slices shown (1 of 2)]
[im 1/3]
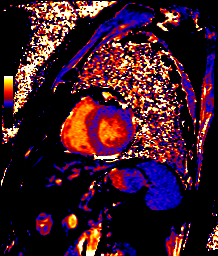

[Series 24: (id)_long_t1_moco_t1 · oblique · 8.0mm · 1.41mm/px · 1 of 3 slices shown (2 of 2)]
[im 1/3]
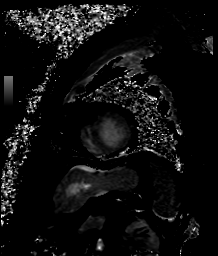

[Series 26: (id)_trufi · oblique · 8.0mm · 1.88mm/px · 1 of 9 slices shown]
[im 1/9]
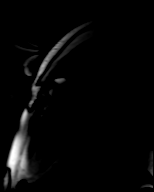

[Series 27: (id)_trufi_moco · oblique · 8.0mm · 1.88mm/px · 1 of 9 slices shown]
[im 1/9]
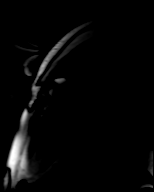

[Series 28: (id)_trufi_moco_t2 · oblique · 8.0mm · 1.88mm/px · 1 of 3 slices shown]
[im 1/3]
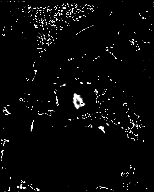

[Series 30: bSSFP · coronal · 6.0mm · 1.41mm/px · 1 of 25 slices shown (14 of 21)]
[im 1/25]
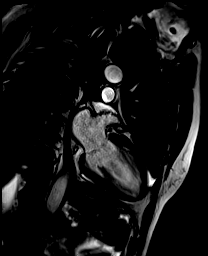

[Series 31: bSSFP · axial · 6.0mm · 1.41mm/px · 1 of 25 slices shown (15 of 21)]
[im 1/25]
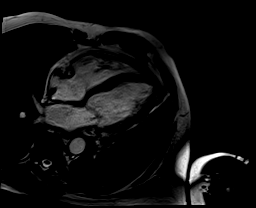

[Series 34: bSSFP · oblique · 6.0mm · 1.41mm/px · 1 of 25 slices shown (16 of 21)]
[im 1/25]
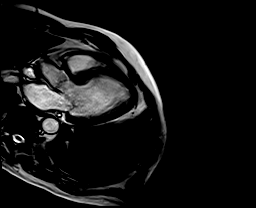

[Series 35: bSSFP · coronal · 6.0mm · 1.41mm/px · 1 of 25 slices shown (17 of 21)]
[im 1/25]
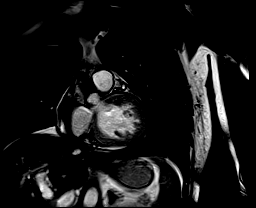

[Series 36: aortic valve cine · oblique · 6.0mm · 1.41mm/px · 1 of 25 slices shown]
[im 1/25]
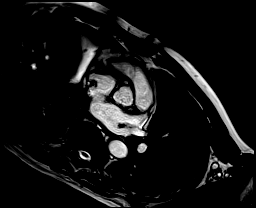

[Series 37: cine rvit · coronal · 6.0mm · 1.41mm/px · 1 of 25 slices shown]
[im 1/25]
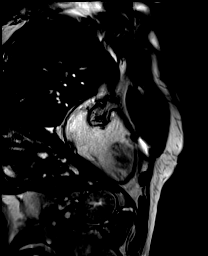

[Series 38: cine rvot · sagittal · 6.0mm · 1.41mm/px · 1 of 25 slices shown]
[im 1/25]
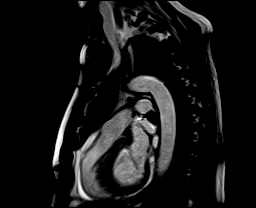

[Series 40: lge_single shot sa · oblique · 8.0mm · 1.98mm/px · 1 of 10 slices shown (1 of 2)]
[im 1/10]
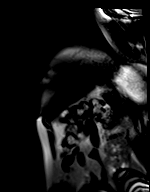

[Series 41: lge_single shot sa · oblique · 8.0mm · 1.98mm/px · 1 of 10 slices shown (2 of 2)]
[im 1/10]
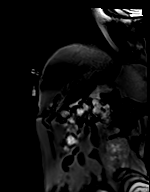

[Series 44: lge ss 4 · axial · 6.0mm · 1.98mm/px · 1 of 1 slices shown (1 of 4)]
[im 1/1]
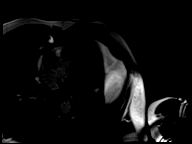

[Series 45: lge ss 4 · axial · 6.0mm · 1.98mm/px · 1 of 1 slices shown (2 of 4)]
[im 1/1]
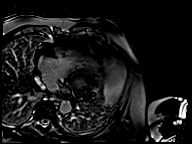

[Series 46: lge ss 4 · axial · 6.0mm · 1.98mm/px · 1 of 1 slices shown (3 of 4)]
[im 1/1]
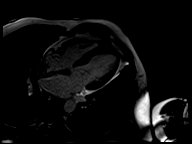

[Series 47: lge ss 4 · axial · 6.0mm · 1.98mm/px · 1 of 1 slices shown (4 of 4)]
[im 1/1]
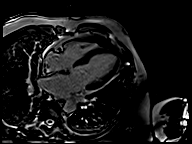

[Series 50: (id)_short_t1 · oblique · 8.0mm · 1.41mm/px · 1 of 27 slices shown]
[im 1/27]
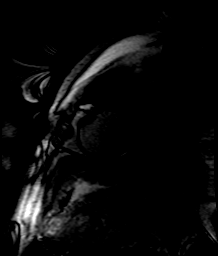

[Series 51: (id)_short_t1_moco · oblique · 8.0mm · 1.41mm/px · 1 of 27 slices shown]
[im 1/27]
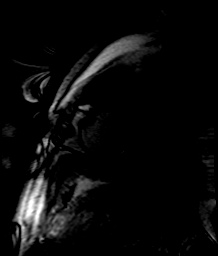

[Series 52: (id)_short_t1_moco_t1 · oblique · 8.0mm · 1.41mm/px · 1 of 3 slices shown (1 of 2)]
[im 1/3]
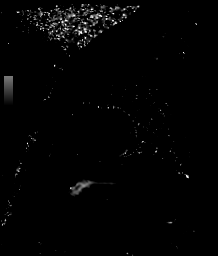

[Series 52: (id)_short_t1_moco_t1 · 1 of 3 slices shown (2 of 2)]
[im 1/3]
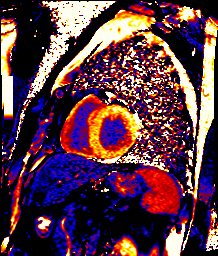

[Series 55: bSSFP · oblique · 8.0mm · 1.73mm/px · 1 of 10 slices shown (18 of 21)]
[im 1/10]
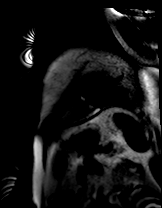

[Series 56: bSSFP · oblique · 8.0mm · 1.73mm/px · 1 of 10 slices shown (19 of 21)]
[im 1/10]
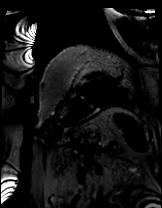

[Series 61: bSSFP · axial · 6.0mm · 1.73mm/px · 1 of 1 slices shown (20 of 21)]
[im 1/1]
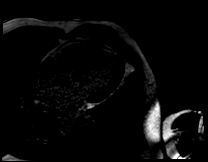

[Series 62: bSSFP · axial · 6.0mm · 1.73mm/px · 1 of 1 slices shown (21 of 21)]
[im 1/1]
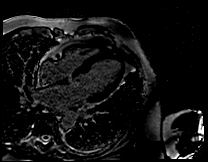

[42 of 45 positions shown; findings below may reference images not displayed]

FINDINGS: Limited images of the lung fields showed no gross abnormalities.

Normal left ventricular size with mild focal basal septal
hypertrophy (14 mm basal anteroseptum, 6 mm basal inferolateral
wall). Normal wall motion with EF 58%. No evidence for LV outflow
gradient. Normal right ventricular size and systolic function, EF
55%. Mildly dilated left atrium. Normal right atrium. No mitral
valve systolic anterior motion, mild mitral regurgitation.
Trileaflet aortic valve with no significant stenosis or
regurgitation.

On delayed enhancement imaging, there was no definitive late
gadolinium enhancement (LGE) noted.

Measurements:
LVEDV 152 mL

LVSV 88 mL
LVEF 58%

RVEDV 101 mL

RVSV 55 mL
RVEF 55%

ECV 25%
IMPRESSION: 1.  Normal LV size with mild focal basal septal hypertrophy, EF 58%.

2.  Normal RV size with EF 58%.

3. No myocardial LGE, so no definitive evidence for prior MI,
myocarditis, or infiltrative disease.

4.  Normal ECV

No evidence for significant pathology, possible "senile septum."

Muda Fuji

## 2022-05-25 ENCOUNTER — Other Ambulatory Visit: Payer: Self-pay

## 2022-05-25 MED ORDER — CARVEDILOL 6.25 MG PO TABS: 6.2500 mg | ORAL_TABLET | Freq: Two times a day (BID) | ORAL | 1 refills | Status: DC

## 2022-06-19 ENCOUNTER — Ambulatory Visit: Payer: Medicare HMO | Admitting: Cardiology

## 2022-10-12 ENCOUNTER — Emergency Department (HOSPITAL_BASED_OUTPATIENT_CLINIC_OR_DEPARTMENT_OTHER)
Admission: EM | Admit: 2022-10-12 | Discharge: 2022-10-12 | Disposition: A | Discharge status: Home / Self Care | Payer: Medicare HMO | Attending: Emergency Medicine | Admitting: Emergency Medicine

## 2022-10-12 ENCOUNTER — Emergency Department (HOSPITAL_BASED_OUTPATIENT_CLINIC_OR_DEPARTMENT_OTHER): Payer: Medicare HMO

## 2022-10-12 ENCOUNTER — Encounter (HOSPITAL_BASED_OUTPATIENT_CLINIC_OR_DEPARTMENT_OTHER): Payer: Self-pay | Admitting: Emergency Medicine

## 2022-10-12 DIAGNOSIS — Z7901 Long term (current) use of anticoagulants: Secondary | ICD-10-CM | POA: Diagnosis not present

## 2022-10-12 DIAGNOSIS — Z79899 Other long term (current) drug therapy: Secondary | ICD-10-CM | POA: Diagnosis not present

## 2022-10-12 DIAGNOSIS — E119 Type 2 diabetes mellitus without complications: Secondary | ICD-10-CM | POA: Insufficient documentation

## 2022-10-12 DIAGNOSIS — R0789 Other chest pain: Secondary | ICD-10-CM | POA: Diagnosis present

## 2022-10-12 DIAGNOSIS — I10 Essential (primary) hypertension: Secondary | ICD-10-CM | POA: Diagnosis not present

## 2022-10-12 DIAGNOSIS — R079 Chest pain, unspecified: Secondary | ICD-10-CM

## 2022-10-12 DIAGNOSIS — U071 COVID-19: Secondary | ICD-10-CM | POA: Insufficient documentation

## 2022-10-12 LAB — CBC
HCT: 31.6 % — ABNORMAL LOW (ref 39.0–52.0)
Hemoglobin: 10.1 g/dL — ABNORMAL LOW (ref 13.0–17.0)
MCH: 24 pg — ABNORMAL LOW (ref 26.0–34.0)
MCHC: 32 g/dL (ref 30.0–36.0)
MCV: 75.2 fL — ABNORMAL LOW (ref 80.0–100.0)
Platelets: 215 10*3/uL (ref 150–400)
RBC: 4.2 MIL/uL — ABNORMAL LOW (ref 4.22–5.81)
RDW: 17.1 % — ABNORMAL HIGH (ref 11.5–15.5)
WBC: 8.7 10*3/uL (ref 4.0–10.5)
nRBC: 0 % (ref 0.0–0.2)

## 2022-10-12 LAB — CBG MONITORING, ED: Glucose-Capillary: 171 mg/dL — ABNORMAL HIGH (ref 70–99)

## 2022-10-12 LAB — TROPONIN I (HIGH SENSITIVITY)
Troponin I (High Sensitivity): 10 ng/L (ref <18)
Troponin I (High Sensitivity): 10 ng/L (ref <18)

## 2022-10-12 LAB — RESP PANEL BY RT-PCR (RSV, FLU A&B, COVID)  RVPGX2
Influenza A by PCR: NEGATIVE
Influenza B by PCR: NEGATIVE
Resp Syncytial Virus by PCR: NEGATIVE
SARS Coronavirus 2 by RT PCR: POSITIVE — AB

## 2022-10-12 LAB — BASIC METABOLIC PANEL
Anion gap: 12 (ref 5–15)
BUN: 18 mg/dL (ref 8–23)
CO2: 23 mmol/L (ref 22–32)
Calcium: 8.7 mg/dL — ABNORMAL LOW (ref 8.9–10.3)
Chloride: 100 mmol/L (ref 98–111)
Creatinine, Ser: 1.73 mg/dL — ABNORMAL HIGH (ref 0.61–1.24)
GFR, Estimated: 40 mL/min — ABNORMAL LOW (ref >60)
Glucose, Bld: 191 mg/dL — ABNORMAL HIGH (ref 70–99)
Potassium: 3.5 mmol/L (ref 3.5–5.1)
Sodium: 135 mmol/L (ref 135–145)

## 2022-10-12 NOTE — ED Triage Notes (Addendum)
CP X 1 hr with some left side radiation, denies SOB or diaphoresis. State nose got congested when trying to lay down. Pt state may be related to "sugar or Covid"

## 2022-10-12 NOTE — ED Notes (Signed)
Patient transported to X-ray

## 2022-10-12 NOTE — ED Provider Notes (Signed)
[place] [facility] Provider Note   CSN: [id number] Arrival date & time: 10/12/22  0036     History  Chief Complaint  Patient presents with   Chest Pain    THORNE SIEGENTHALER is a 82 y.o. male.  Patient is a 82 year old male with past medical history of type 2 diabetes, hypertension, chronic renal insufficiency, paroxysmal A-fib.  Patient presenting today with complaints of chest discomfort.  He was lying in bed this evening, then began to feel short of breath.  He sat up, then began with tightness in his chest.  He then stood up and took his dose of Ozempic, then symptoms seem to improve.  Patient arrives here pain-free and without complaint.  He denies shortness of breath.  He denies any recent injury or trauma.  The history is provided by the patient.       Home Medications Prior to Admission medications   Medication Sig Start Date End Date Taking? Authorizing Provider  allopurinol (ZYLOPRIM) 100 MG tablet Take 100 mg by mouth daily.    [provider]  amLODipine (NORVASC) 10 MG tablet Take 1 tablet (10 mg total) by mouth daily. 06/09/20   Little Ishikawa, MD  apixaban (ELIQUIS) 5 MG TABS tablet Take 1 tablet (5 mg total) by mouth 2 (two) times daily. 10/04/21   Marjie Skiff E, PA-C  atorvastatin (LIPITOR) 40 MG tablet Take 40 mg by mouth daily. 06/08/19   [provider]  Blood Glucose Monitoring Suppl (TRUE METRIX METER) w/Device KIT  12/07/19   [provider]  calcitRIOL (ROCALTROL) 0.5 MCG capsule Take 0.5 mcg by mouth daily.    [provider]  carvedilol (COREG) 6.25 MG tablet Take 1 tablet (6.25 mg total) by mouth 2 (two) times daily with a meal. 05/25/22   Little Ishikawa, MD  cholecalciferol (VITAMIN D) 25 MCG tablet Take 1 tablet (1,000 Units total) by mouth daily. 03/09/19   Burnadette Pop, MD  colchicine 0.6 MG tablet Take 0.6-1.2 mg by mouth 2 (two) times daily as needed  (gout).     [provider]  FeFum-FePoly-FA-B Cmp-C-Biot (INTEGRA PLUS) CAPS Take 1 capsule by mouth daily.  03/24/19   [provider]  finasteride (PROSCAR) 5 MG tablet Take 5 mg by mouth daily.     [provider]  liraglutide (VICTOZA) 18 MG/3ML SOPN Victoza 3-Pak 0.6 mg/0.1 mL (18 mg/3 mL) subcutaneous pen injector  INJECT 1.8 MG UNDER THE SKIN ONCE DAILY    [provider]  magnesium oxide (MAG-OX) 400 (241.3 Mg) MG tablet Take 2 tablets (800 mg total) by mouth daily. 03/09/19   Burnadette Pop, MD  Multiple Vitamin (MULITIVITAMIN WITH MINERALS) TABS Take 1 tablet by mouth daily.    [provider]  pantoprazole (PROTONIX) 40 MG tablet Take 1 tablet (40 mg total) by mouth daily. 03/08/19 03/07/20  Burnadette Pop, MD  phosphorus (K PHOS NEUTRAL) [phone] MG tablet Take 1 tablet (250 mg total) by mouth 3 (three) times daily. 03/08/19   Burnadette Pop, MD  potassium chloride SA (KLOR-CON) 20 MEQ tablet Take 20 mEq by mouth daily.    [provider]  tamsulosin (FLOMAX) 0.4 MG CAPS capsule tamsulosin 0.4 mg capsule  TAKE 1 CAPSULE BY MOUTH ONCE DAILY 06/29/19   [provider]  traMADol (ULTRAM) 50 MG tablet Take 1-2 tablets (50-100 mg total) by mouth every 6 (six) hours as needed. 06/18/19   Darnell Level, MD  valsartan (DIOVAN)  80 MG tablet Take 1 tablet (80 mg total) by mouth daily. PATIENT NEEDS TO SCHEDULE AN OFFICE VISIT WITH DR [facility] FOR FUTURE REFILLS. (LAST ATTEMPT) 04/24/21   Little Ishikawa, MD      Allergies    Patient has no known allergies.    Review of Systems   Review of Systems  All other systems reviewed and are negative.   Physical Exam Updated Vital Signs BP (!) 151/58 (BP Location: Right Arm)   Pulse 92   Resp 18   Ht 6\' 2"  (1.88 m)   Wt 97.5 kg   SpO2 100%   BMI 27.60 kg/m  Physical Exam Vitals and nursing note reviewed.  Constitutional:      General: He is not in acute distress.     Appearance: He is well-developed. He is not diaphoretic.  HENT:     Head: Normocephalic and atraumatic.  Cardiovascular:     Rate and Rhythm: Normal rate and regular rhythm.     Heart sounds: No murmur heard.    No friction rub.  Pulmonary:     Effort: Pulmonary effort is normal. No respiratory distress.     Breath sounds: Normal breath sounds. No wheezing or rales.  Abdominal:     General: Bowel sounds are normal. There is no distension.     Palpations: Abdomen is soft.     Tenderness: There is no abdominal tenderness.  Musculoskeletal:        General: Normal range of motion.     Cervical back: Normal range of motion and neck supple.     Right lower leg: No tenderness. No edema.     Left lower leg: No tenderness. No edema.  Skin:    General: Skin is warm and dry.  Neurological:     Mental Status: He is alert and oriented to person, place, and time.     Coordination: Coordination normal.     ED Results / Procedures / Treatments   Labs (all labs ordered are listed, but only abnormal results are displayed) Labs Reviewed  BASIC METABOLIC PANEL - Abnormal; Notable for the following components:      Result Value   Glucose, Bld 191 (*)    Creatinine, Ser 1.73 (*)    Calcium 8.7 (*)    GFR, Estimated 40 (*)    All other components within normal limits  CBC - Abnormal; Notable for the following components:   RBC 4.20 (*)    Hemoglobin 10.1 (*)    HCT 31.6 (*)    MCV 75.2 (*)    MCH 24.0 (*)    RDW 17.1 (*)    All other components within normal limits  CBG MONITORING, ED - Abnormal; Notable for the following components:   Glucose-Capillary 171 (*)    All other components within normal limits  TROPONIN I (HIGH SENSITIVITY)  TROPONIN I (HIGH SENSITIVITY)    EKG EKG Interpretation Date/Time:  Friday October 12 2022 00:43:24 EDT Ventricular Rate:  98 PR Interval:  242 QRS Duration:  80 QT Interval:  340 QTC Calculation: 434 R Axis:   60  Text Interpretation: Sinus  rhythm with marked sinus arrhythmia with 1st degree A-V block Low voltage QRS Borderline ECG When compared with ECG of 12-May-2021 17:47, no significant change is noted Confirmed by Geoffery Lyons ([id number]) on 10/12/2022 1:37:43 AM  Radiology DG Chest 2 View  Result Date: 10/12/2022 CLINICAL DATA:  Chest pain EXAM: CHEST - 2 VIEW COMPARISON:  05/12/2021 FINDINGS: The heart  size and mediastinal contours are within normal limits. Both lungs are clear. The visualized skeletal structures are unremarkable. IMPRESSION: No active cardiopulmonary disease. Electronically Signed   By: Alcide Clever M.D.   On: 10/12/2022 01:16    Procedures Procedures    Medications Ordered in ED Medications - No data to display  ED Course/ Medical Decision Making/ A&P  Is a 82 year old male with past medical history as per HPI presenting with complaints of chest discomfort.  Patient arrives here symptom-free with stable vital signs and normal physical examination.  Workup initiated including CBC, metabolic panel, and troponin x 2.  All studies unremarkable.  Chest x-ray shows no acute process.  Patient has undergone troponin and delta troponin, both of which are normal.  His EKG is unchanged.  Patient's symptoms are very atypical for cardiac pain and seem to improve after he gave himself his injection of Ozempic.  Patient has been observed for several hours and has had no further episodes.  I feel as though he can safely be discharged with outpatient follow-up and as needed return.  Final Clinical Impression(s) / ED Diagnoses Final diagnoses:  None    Rx / DC Orders ED Discharge Orders     None         Geoffery Lyons, MD 10/12/22 0345

## 2022-10-12 NOTE — Discharge Instructions (Signed)
Continue medications as previously prescribed.  Follow-up with primary doctor next week, and return to the ER if your symptoms significantly worsen or change.

## 2022-10-22 NOTE — Progress Notes (Unsigned)
Cardiology Office Note:    Date:  10/22/2022   ID:  [name], [name] 03-16-1940, MRN [id number]  PCP:  Andi Devon, MD  Cardiologist:  Little Ishikawa, MD  Electrophysiologist:  None   Referring MD: Andi Devon, MD   No chief complaint on file.   History of Present Illness:    Martin Richards is a 82 y.o. male with a hx of diabetes, hypertension, CKD who presents for follow-up.  He was admitted to [facility] in February 2021 with dehydration and orthostatic hypotension.  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  Labs in the ED notable for AKI with creatinine 2.5, along with hypercalcemia and transaminitis.  Improved with IV fluids.  Work-up of his hypercalcemia showed elevated PTH.  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  He was started on cinacalcet and will follow up with endocrinology/general surgery as outpatient.  During admission, he went into atrial fibrillation with RVR.  This was a new diagnosis.  Rates were initially up to the 140s, improved with diltiazem drip.  He subsequently converted back to normal sinus rhythm, with rate in 60s.  He was started on Eliquis 5 mg twice daily and converted from diltiazem drip to p.o. diltiazem CD 120 mg daily.  TTE was notable for EF 65 to 70%, basal septal hypertrophy.  Cardiac MRI was planned as an outpatient to evaluate etiology (amyloidosis versus HCM versus hypertensive heart disease).  He had heme positive stool and underwent EGD/ colonoscopy.  EGD showed gastritis.  Colonoscopy showed multiple polyps, which were removed.  After discharge from the hospital, he was readmitted in [facility] shortly after discharge with a GI bleed.  Reports that Eliquis was stopped, he received multiple transfusions, and GI bleeding resolved.  He did not undergo repeat EGD/colonoscopy.  He saw gastroenterology and Eliquis was restarted.  Reports that GI felt that bleed  was secondary to starting Eliquis too soon after polyps were removed.  He reported no further bleeding since restarting Eliquis.    Cardiac MRI on 06/10/2019 to work-up basal septal hypertrophy, showed mild focal basal septal hypertrophy (14 mm basal anterior septum), not meeting criteria for HCM.  Normal biventricular function.  No LGE.  Since last clinic visit, he was seen in the ED for chest pain on 10/12/2022.  Workup, including troponins x 2, was unremarkable.  reports he has been doing well. His blood pressure at home has been around 160/70 he reports, and is currently taking diltiazem. He has occasionally been taking Lasix, but not frequently as he is still working. After eating large meals he has some shortness of breath, which he attributes to being a diabetic. He does not believe he has gone back into Afib. He denies any chest pain, palpitations, lightheadedness, or syncope. Denies any bleeding issues in his stool or urine.    BP Readings from Last 3 Encounters:  10/12/22 (!) 155/74  10/04/21 (!) 144/68  05/12/21 (!) 149/62      Past Medical History:  Diagnosis Date   Diabetes mellitus    Dysrhythmia    Hx. AFIB   Gout    Hypertension     Past Surgical History:  Procedure Laterality Date   BIOPSY  03/06/2019   Procedure: BIOPSY;  Surgeon: Jeani Hawking, MD;  Location: [facility] ENDOSCOPY;  Service: Endoscopy;;   CHOLECYSTECTOMY     COLONOSCOPY WITH PROPOFOL N/A 03/06/2019   Procedure: COLONOSCOPY WITH PROPOFOL;  Surgeon: Elnoria Howard,  Luisa Hart, MD;  Location: [facility] ENDOSCOPY;  Service: Endoscopy;  Laterality: N/A;   ESOPHAGOGASTRODUODENOSCOPY (EGD) WITH PROPOFOL N/A 03/06/2019   Procedure: ESOPHAGOGASTRODUODENOSCOPY (EGD) WITH PROPOFOL;  Surgeon: Jeani Hawking, MD;  Location: [facility] ENDOSCOPY;  Service: Endoscopy;  Laterality: N/A;   HEMOSTASIS CLIP PLACEMENT  03/06/2019   Procedure: HEMOSTASIS CLIP PLACEMENT;  Surgeon: Jeani Hawking, MD;  Location: [facility] ENDOSCOPY;  Service: Endoscopy;;    PARATHYROIDECTOMY N/A 06/18/2019   Procedure: NECK EXPLORATION AND LEFT INFERIOR PARATHYROIDECTOMY;  Surgeon: Darnell Level, MD;  Location: [facility] ORS;  Service: General;  Laterality: N/A;   POLYPECTOMY  03/06/2019   Procedure: POLYPECTOMY;  Surgeon: Jeani Hawking, MD;  Location: [facility] ENDOSCOPY;  Service: Endoscopy;;   SUBMUCOSAL LIFTING INJECTION  03/06/2019   Procedure: SUBMUCOSAL LIFTING INJECTION;  Surgeon: Jeani Hawking, MD;  Location: [facility] ENDOSCOPY;  Service: Endoscopy;;    Current Medications: No outpatient medications have been marked as taking for the 10/23/22 encounter (Appointment) with Little Ishikawa, MD.     Allergies:   Patient has no known allergies.   Social History   Socioeconomic History   Marital status: Married    Spouse name: Not on file   Number of children: Not on file   Years of education: Not on file   Highest education level: Not on file  Occupational History   Not on file  Tobacco Use   Smoking status: Never   Smokeless tobacco: Never  Vaping Use   Vaping status: Never Used  Substance and Sexual Activity   Alcohol use: Yes    Comment: rare   Drug use: No   Sexual activity: Not on file  Other Topics Concern   Not on file  Social History Narrative   Not on file   Social Determinants of Health   Financial Resource Strain: Not on file  Food Insecurity: Not on file  Transportation Needs: Not on file  Physical Activity: Not on file  Stress: Not on file  Social Connections: Not on file     Family History: The patient's family history includes Cancer in his sister; Diabetes in his brother; Hypertension in his brother, father, and mother.  ROS:   Please see the history of present illness.    (+) Shortness of breath All other systems reviewed and are negative.  EKGs/Labs/Other Studies Reviewed:    The following studies were reviewed today:   EKG:   07/29/2019: normal sinus rhythm with sinus arrhythmia, rate 77 , first-degree AV block, no ST  abnormalities 05/23/2020: normal sinus rhythm with PACs, rate 71 bpm, first-degree AV block, non-specific T wave finding  TTE 03/04/19:  1. Left ventricular ejection fraction, by estimation, is 65 to 70%. The  left ventricle has normal function. The left ventrical has no regional  wall motion abnormalities. There is moderately increased left ventricular  hypertrophy of the basal-septal  segment. Left ventricular diastolic function could not be evaluated  secondary to atrial fibrillation.   2. Right ventricular systolic function is normal. The right ventricular  size is normal. Tricuspid regurgitation signal is inadequate for assessing  PA pressure.   3. The mitral valve is normal in structure and function. trivial mitral  valve regurgitation. No evidence of mitral stenosis.   4. The aortic valve is tricuspid. Aortic valve regurgitation is not  visualized. Mild aortic valve sclerosis is present, with no evidence of  aortic valve stenosis.   5. The inferior vena cava is dilated in size with <50% respiratory  variability, suggesting right atrial pressure of 15  mmHg.   Recent Labs: 10/12/2022: BUN 18; Creatinine, Ser 1.73; Hemoglobin 10.1; Platelets 215; Potassium 3.5; Sodium 135  Recent Lipid Panel No results found for: "CHOL", "TRIG", "HDL", "CHOLHDL", "VLDL", "LDLCALC", "LDLDIRECT"  Physical Exam:    VS:  There were no vitals taken for this visit.    Wt Readings from Last 3 Encounters:  10/12/22 215 lb (97.5 kg)  10/04/21 215 lb 12.8 oz (97.9 kg)  05/12/21 215 lb (97.5 kg)     GEN:  Well nourished, well developed in no acute distress HEENT: Normal NECK: No JVD; No carotid bruits LYMPHATICS: No lymphadenopathy CARDIAC: RRR, no murmurs, rubs, gallops RESPIRATORY:  Clear to auscultation without rales, wheezing or rhonchi  ABDOMEN: Soft, non-tender, non-distended MUSCULOSKELETAL:  Trace bilateral LE edema; No deformity  SKIN: Warm and dry NEUROLOGIC:  Alert and oriented x  3 PSYCHIATRIC:  Normal affect   ASSESSMENT:    No diagnosis found.  PLAN:    Paroxysmal atrial fibrillation: Diagnosed during admission 02/2019 when found to have hyperparathyroidism and dehydration.  CHA2DS2-VASc score 4 (hypertension, age x2, diabetes).  Normal LV systolic function on TTE -Continue Eliquis 5 mg twice daily.  -Continue carvedilol 6.25 mg twice daily  LVH: Cardiac MRI on 06/10/2019 to work-up basal septal hypertrophy, showed mild focal basal septal hypertrophy (14 mm basal anterior septum), not meeting criteria for HCM.  Normal biventricular function.  No LGE.  Consistent with hypertensive heart disease.  Hypertension: On amlodipine 10 mg daily, carvedilol 6.25 mg twice daily, valsartan 80 mg daily  CKD stage IIIa: Creatinine 1.73 on 10/12/2022  Hyperlipidemia: Continue atorvastatin 40 mg daily  RTC in 6 months***  Medication Adjustments/Labs and Tests Ordered: Current medicines are reviewed at length with the patient today.  Concerns regarding medicines are outlined above.  No orders of the defined types were placed in this encounter.  No orders of the defined types were placed in this encounter.   There are no Patient Instructions on file for this visit.     Signed, Little Ishikawa, MD  10/22/2022 8:45 PM    [facility]

## 2022-10-23 ENCOUNTER — Encounter: Payer: Self-pay | Admitting: Cardiology

## 2022-10-23 ENCOUNTER — Ambulatory Visit: Payer: Medicare HMO | Attending: Cardiology | Admitting: Cardiology

## 2022-10-23 VITALS — BP 146/72 | HR 72 | Ht 74.4 in | Wt 211.0 lb

## 2022-10-23 DIAGNOSIS — E785 Hyperlipidemia, unspecified: Secondary | ICD-10-CM

## 2022-10-23 DIAGNOSIS — N1832 Chronic kidney disease, stage 3b: Secondary | ICD-10-CM

## 2022-10-23 DIAGNOSIS — I480 Paroxysmal atrial fibrillation: Secondary | ICD-10-CM

## 2022-10-23 DIAGNOSIS — I10 Essential (primary) hypertension: Secondary | ICD-10-CM | POA: Diagnosis not present

## 2022-10-23 DIAGNOSIS — I517 Cardiomegaly: Secondary | ICD-10-CM

## 2022-10-23 MED ORDER — APIXABAN 2.5 MG PO TABS: 2.5000 mg | ORAL_TABLET | Freq: Two times a day (BID) | ORAL | 3 refills | Status: DC

## 2022-10-23 NOTE — Patient Instructions (Signed)
Medication Instructions:  Decrease Eliquis to 2.5 mg twice a day  Continue all other medications *If you need a refill on your cardiac medications before your next appointment, please call your pharmacy*   Lab Work: Cmet,magnesium,cbc,lipid panel today    Testing/Procedures: Echo   Follow-Up: At [facility], you and your health needs are our priority.  As part of our continuing mission to provide you with exceptional heart care, we have created designated Provider Care Teams.  These Care Teams include your primary Cardiologist (physician) and Advanced Practice Providers (APPs -  Physician Assistants and Nurse Practitioners) who all work together to provide you with the care you need, when you need it.  We recommend signing up for the patient portal called "MyChart".  Sign up information is provided on this After Visit Summary.  MyChart is used to connect with patients for Virtual Visits (Telemedicine).  Patients are able to view lab/test results, encounter notes, upcoming appointments, etc.  Non-urgent messages can be sent to your provider as well.   To learn more about what you can do with MyChart, go to [email or web address].    Your next appointment:  6 months    Provider:  Dr.Schumann   Check blood pressure twice a day for 1 week Call office or send results to [facility]

## 2022-10-24 LAB — COMPREHENSIVE METABOLIC PANEL
ALT: 65 [IU]/L — ABNORMAL HIGH (ref 0–44)
AST: 57 [IU]/L — ABNORMAL HIGH (ref 0–40)
Albumin: 4.4 g/dL (ref 3.8–4.8)
Alkaline Phosphatase: 113 [IU]/L (ref 44–121)
BUN/Creatinine Ratio: 11 (ref 10–24)
BUN: 16 mg/dL (ref 8–27)
Bilirubin Total: 0.3 mg/dL (ref 0.0–1.2)
CO2: 26 mmol/L (ref 20–29)
Calcium: 9.8 mg/dL (ref 8.6–10.2)
Chloride: 102 mmol/L (ref 96–106)
Creatinine, Ser: 1.5 mg/dL — ABNORMAL HIGH (ref 0.76–1.27)
Globulin, Total: 3 g/dL (ref 1.5–4.5)
Glucose: 109 mg/dL — ABNORMAL HIGH (ref 70–99)
Potassium: 4.9 mmol/L (ref 3.5–5.2)
Sodium: 138 mmol/L (ref 134–144)
Total Protein: 7.4 g/dL (ref 6.0–8.5)
eGFR: 47 mL/min/{1.73_m2} — ABNORMAL LOW (ref >59)

## 2022-10-24 LAB — CBC WITH DIFFERENTIAL/PLATELET
Basophils Absolute: 0.1 10*3/uL (ref 0.0–0.2)
Basos: 1 %
EOS (ABSOLUTE): 0.2 10*3/uL (ref 0.0–0.4)
Eos: 2 %
Hematocrit: 36.6 % — ABNORMAL LOW (ref 37.5–51.0)
Hemoglobin: 11.1 g/dL — ABNORMAL LOW (ref 13.0–17.7)
Immature Grans (Abs): 0 10*3/uL (ref 0.0–0.1)
Immature Granulocytes: 0 %
Lymphocytes Absolute: 2.3 10*3/uL (ref 0.7–3.1)
Lymphs: 24 %
MCH: 23.6 pg — ABNORMAL LOW (ref 26.6–33.0)
MCHC: 30.3 g/dL — ABNORMAL LOW (ref 31.5–35.7)
MCV: 78 fL — ABNORMAL LOW (ref 79–97)
Monocytes Absolute: 0.6 10*3/uL (ref 0.1–0.9)
Monocytes: 7 %
Neutrophils Absolute: 6.3 10*3/uL (ref 1.4–7.0)
Neutrophils: 66 %
Platelets: 356 10*3/uL (ref 150–450)
RBC: 4.71 x10E6/uL (ref 4.14–5.80)
RDW: 17.5 % — ABNORMAL HIGH (ref 11.6–15.4)
WBC: 9.4 10*3/uL (ref 3.4–10.8)

## 2022-10-24 LAB — LIPID PANEL
Chol/HDL Ratio: 6.3 {ratio} — ABNORMAL HIGH (ref 0.0–5.0)
Cholesterol, Total: 177 mg/dL (ref 100–199)
HDL: 28 mg/dL — ABNORMAL LOW (ref >39)
LDL Chol Calc (NIH): 111 mg/dL — ABNORMAL HIGH (ref 0–99)
Triglycerides: 218 mg/dL — ABNORMAL HIGH (ref 0–149)
VLDL Cholesterol Cal: 38 mg/dL (ref 5–40)

## 2022-10-24 LAB — MAGNESIUM: Magnesium: 1.9 mg/dL (ref 1.6–2.3)

## 2022-11-28 ENCOUNTER — Ambulatory Visit (HOSPITAL_COMMUNITY): Payer: Medicare HMO | Attending: Cardiovascular Disease

## 2022-11-28 DIAGNOSIS — I480 Paroxysmal atrial fibrillation: Secondary | ICD-10-CM | POA: Diagnosis present

## 2022-11-28 DIAGNOSIS — N1832 Chronic kidney disease, stage 3b: Secondary | ICD-10-CM | POA: Diagnosis not present

## 2022-11-28 DIAGNOSIS — E785 Hyperlipidemia, unspecified: Secondary | ICD-10-CM | POA: Insufficient documentation

## 2022-11-28 DIAGNOSIS — I517 Cardiomegaly: Secondary | ICD-10-CM | POA: Insufficient documentation

## 2022-11-28 DIAGNOSIS — I10 Essential (primary) hypertension: Secondary | ICD-10-CM | POA: Diagnosis not present

## 2022-11-28 LAB — ECHOCARDIOGRAM COMPLETE
Area-P 1/2: 3.28 cm2
S' Lateral: 3.1 cm

## 2022-12-04 ENCOUNTER — Other Ambulatory Visit: Payer: Self-pay | Admitting: Cardiology

## 2023-09-12 ENCOUNTER — Emergency Department (HOSPITAL_BASED_OUTPATIENT_CLINIC_OR_DEPARTMENT_OTHER): Admission: EM | Admit: 2023-09-12 | Discharge: 2023-09-12 | Disposition: A | Discharge status: Home / Self Care

## 2023-09-12 ENCOUNTER — Encounter (HOSPITAL_BASED_OUTPATIENT_CLINIC_OR_DEPARTMENT_OTHER): Payer: Self-pay

## 2023-09-12 ENCOUNTER — Other Ambulatory Visit: Payer: Self-pay

## 2023-09-12 DIAGNOSIS — Z7901 Long term (current) use of anticoagulants: Secondary | ICD-10-CM | POA: Diagnosis not present

## 2023-09-12 DIAGNOSIS — R339 Retention of urine, unspecified: Secondary | ICD-10-CM | POA: Diagnosis present

## 2023-09-12 LAB — URINALYSIS, ROUTINE W REFLEX MICROSCOPIC
Bilirubin Urine: NEGATIVE
Glucose, UA: NEGATIVE mg/dL
Ketones, ur: NEGATIVE mg/dL
Nitrite: NEGATIVE
Protein, ur: 100 mg/dL — AB
Specific Gravity, Urine: 1.015 (ref 1.005–1.030)
pH: 6.5 (ref 5.0–8.0)

## 2023-09-12 LAB — URINALYSIS, MICROSCOPIC (REFLEX)

## 2023-09-12 MED ORDER — LIDOCAINE HCL URETHRAL/MUCOSAL 2 % EX GEL
1.0000 | Freq: Once | CUTANEOUS | Status: AC
  Administered 2023-09-12: 1 via URETHRAL
  Filled 2023-09-12: qty 11

## 2023-09-12 NOTE — ED Triage Notes (Signed)
 States urinated this morning, but now unable to urinate much. Feels abdominal pressure. Hx of similar, has had a catheter a few years ago.

## 2023-09-12 NOTE — Discharge Instructions (Addendum)
 Follow-up with urology to have your Foley catheter removed.

## 2023-09-12 NOTE — ED Provider Notes (Signed)
 [name] [facility] AT [facility] Provider Note   CSN: [id number] Arrival date & time: 09/12/23  1034     Patient presents with: Urinary Retention   Martin Richards is a 83 y.o. male.   83 year old male presents for evaluation of urinary retention.  He has had this happen before and required a catheter in the past.  States he last urinated at 2 AM and it was not much and then this morning he has been unable to urinate.  Denies any other symptoms or concerns at this time.        Prior to Admission medications   Medication Sig Start Date End Date Taking? Authorizing Provider  allopurinol  (ZYLOPRIM ) 100 MG tablet Take 100 mg by mouth daily.    [provider]  amLODipine  (NORVASC ) 10 MG tablet Take 1 tablet (10 mg total) by mouth daily. 06/09/20   Kate Lonni CROME, MD  apixaban  (ELIQUIS ) 2.5 MG TABS tablet Take 1 tablet (2.5 mg total) by mouth 2 (two) times daily. 10/23/22   Kate Lonni CROME, MD  atorvastatin  (LIPITOR) 40 MG tablet Take 40 mg by mouth daily. 06/08/19   [provider]  Blood Glucose Monitoring Suppl (TRUE METRIX METER) w/Device KIT  12/07/19   [provider]  calcitRIOL  (ROCALTROL ) 0.5 MCG capsule Take 0.5 mcg by mouth daily.    [provider]  carvedilol  (COREG ) 6.25 MG tablet TAKE 1 TABLET TWICE DAILY WITH MEALS 12/04/22   Kate Lonni CROME, MD  cholecalciferol  (VITAMIN D ) 25 MCG tablet Take 1 tablet (1,000 Units total) by mouth daily. 03/09/19   Adhikari, Amrit, MD  colchicine 0.6 MG tablet Take 0.6-1.2 mg by mouth 2 (two) times daily as needed (gout).     [provider]  FeFum-FePoly-FA-B Cmp-C-Biot (INTEGRA PLUS) CAPS Take 1 capsule by mouth daily.  03/24/19   [provider]  finasteride  (PROSCAR ) 5 MG tablet Take 5 mg by mouth daily.     [provider]  liraglutide (VICTOZA) 18 MG/3ML SOPN Victoza 3-Pak 0.6 mg/0.1 mL (18 mg/3 mL) subcutaneous pen injector   INJECT 1.8 MG UNDER THE SKIN ONCE DAILY Patient not taking: Reported on 10/23/2022    [provider]  magnesium  oxide (MAG-OX) 400 (241.3 Mg) MG tablet Take 2 tablets (800 mg total) by mouth daily. 03/09/19   Adhikari, Amrit, MD  Multiple Vitamin (MULITIVITAMIN WITH MINERALS) TABS Take 1 tablet by mouth daily.    [provider]  pantoprazole  (PROTONIX ) 40 MG tablet Take 1 tablet (40 mg total) by mouth daily. 03/08/19 03/07/20  Adhikari, Amrit, MD  phosphorus (K PHOS  NEUTRAL) 155-852-130 MG tablet Take 1 tablet (250 mg total) by mouth 3 (three) times daily. 03/08/19   Jillian Buttery, MD  potassium chloride  SA (KLOR-CON ) 20 MEQ tablet Take 20 mEq by mouth daily.    [provider]  tamsulosin  (FLOMAX ) 0.4 MG CAPS capsule tamsulosin  0.4 mg capsule  TAKE 1 CAPSULE BY MOUTH ONCE DAILY 06/29/19   [provider]  traMADol  (ULTRAM ) 50 MG tablet Take 1-2 tablets (50-100 mg total) by mouth every 6 (six) hours as needed. 06/18/19   Eletha Boas, MD  valsartan  (DIOVAN ) 80 MG tablet Take 1 tablet (80 mg total) by mouth daily. PATIENT NEEDS TO SCHEDULE AN OFFICE VISIT WITH DR [facility] FOR FUTURE REFILLS. (LAST ATTEMPT) 04/24/21   Kate Lonni CROME, MD    Allergies: Patient has no known allergies.    Review of Systems  Constitutional:  Negative for chills  and fever.  HENT:  Negative for ear pain and sore throat.   Eyes:  Negative for pain and visual disturbance.  Respiratory:  Negative for cough and shortness of breath.   Cardiovascular:  Negative for chest pain and palpitations.  Gastrointestinal:  Negative for abdominal pain and vomiting.  Genitourinary:  Positive for decreased urine volume and difficulty urinating. Negative for dysuria and hematuria.  Musculoskeletal:  Negative for arthralgias and back pain.  Skin:  Negative for color change and rash.  Neurological:  Negative for seizures and syncope.  All other systems reviewed and are negative.   Updated Vital  Signs BP (!) 152/63   Pulse 72   Temp (!) 97.4 F (36.3 C)   Resp 18   Ht 6' 2 (1.88 m)   Wt 96.2 kg   SpO2 100%   BMI 27.22 kg/m   Physical Exam Vitals and nursing note reviewed.  Constitutional:      General: He is not in acute distress.    Appearance: Normal appearance. He is well-developed. He is not ill-appearing.  HENT:     Head: Normocephalic and atraumatic.  Eyes:     Conjunctiva/sclera: Conjunctivae normal.  Cardiovascular:     Rate and Rhythm: Normal rate and regular rhythm.     Heart sounds: No murmur heard. Pulmonary:     Effort: Pulmonary effort is normal. No respiratory distress.     Breath sounds: Normal breath sounds.  Abdominal:     Palpations: Abdomen is soft.     Comments: Mild suprapubic tenderness to palpation  Musculoskeletal:        General: No swelling.     Cervical back: Neck supple.  Skin:    General: Skin is warm and dry.     Capillary Refill: Capillary refill takes less than 2 seconds.  Neurological:     Mental Status: He is alert.  Psychiatric:        Mood and Affect: Mood normal.     (all labs ordered are listed, but only abnormal results are displayed) Labs Reviewed  URINALYSIS, ROUTINE W REFLEX MICROSCOPIC - Abnormal; Notable for the following components:      Result Value   APPearance HAZY (*)    Hgb urine dipstick MODERATE (*)    Protein, ur 100 (*)    Leukocytes,Ua TRACE (*)    All other components within normal limits  URINALYSIS, MICROSCOPIC (REFLEX) - Abnormal; Notable for the following components:   Bacteria, UA MANY (*)    All other components within normal limits    EKG: None  Radiology: No results found.   BLADDER CATHETERIZATION  Date/Time: 09/12/2023 12:15 PM  Performed by: Gennaro Duwaine CROME, DO Authorized by: Gennaro Duwaine CROME, DO   Consent:    Consent obtained:  Verbal   Consent given by:  Patient Pre-procedure details:    Procedure purpose:  Therapeutic   Preparation: Patient was prepped and  draped in usual sterile fashion   Anesthesia:    Anesthesia method:  None Procedure details:    Provider performed due to:  Nurse unable to complete and complicated insertion   Catheter insertion:  Indwelling   Catheter type:  Coude   Catheter size:  16 Fr   Bladder irrigation: no     Number of attempts:  1   Urine characteristics:  Clear Post-procedure details:    Procedure completion:  Tolerated well, no immediate complications    Medications Ordered in the ED  lidocaine  (XYLOCAINE ) 2 % jelly 1 Application (  1 Application Urethral Given by Other 09/12/23 1127)                                    Medical Decision Making Patient here with acute urinary retention.  Had 521 mL in his bladder.  I placed a Foley catheter as above.  Patient had no immediate complications and tolerated the procedure well.  UA reviewed by me and unremarkable for any UTI.  Patient will plan to follow-up with urology.  Advised return to the ER for new or worsening symptoms.  He feels comfortable being discharged home.  Problems Addressed: Urinary retention: acute illness or injury that poses a threat to life or bodily functions  Amount and/or Complexity of Data Reviewed External Data Reviewed: notes.    Details: Prior records reviewed and patient with history of urinary retention and BPH and required a Foley catheter in the past. Labs: ordered. Decision-making details documented in ED Course.    Details: Ordered and reviewed by me and no evidence of urinary tract infection  Risk OTC drugs. Prescription drug management.    Final diagnoses:  Urinary retention    ED Discharge Orders     None          Gennaro Duwaine CROME, DO 09/12/23 1417

## 2023-09-12 NOTE — ED Notes (Signed)
 Discharge instructions reviewed with patient. Patient verbalizes understanding, no further questions at this time. Follow up information provided. No acute distress noted at time of departure. Foley leg bag provided and instruction given on how to use.

## 2023-09-18 ENCOUNTER — Other Ambulatory Visit: Payer: Self-pay | Admitting: Cardiology

## 2023-12-12 ENCOUNTER — Other Ambulatory Visit: Payer: Self-pay | Admitting: Cardiology

## 2023-12-12 NOTE — Telephone Encounter (Signed)
 Prescription refill request for Eliquis  received. Indication:afib Last office visit:needs appt Scr: Age:  Weight:  Prescription refilled

## 2023-12-21 ENCOUNTER — Other Ambulatory Visit: Payer: Self-pay | Admitting: Cardiology

## 2024-01-24 ENCOUNTER — Other Ambulatory Visit: Payer: Self-pay | Admitting: Cardiology

## 2024-02-19 ENCOUNTER — Other Ambulatory Visit: Payer: Self-pay | Admitting: Cardiology
